# Patient Record
Sex: Male | Born: 1970 | Race: White | Hispanic: No | Marital: Single | Smoking: Former smoker
Health system: Southern US, Community
[De-identification: ages and names within clinical notes are randomized; demographics above are authoritative.]

## PROBLEM LIST (undated history)

## (undated) DIAGNOSIS — K219 Gastro-esophageal reflux disease without esophagitis: Secondary | ICD-10-CM

## (undated) DIAGNOSIS — K3532 Acute appendicitis with perforation and localized peritonitis, without abscess: Secondary | ICD-10-CM

## (undated) DIAGNOSIS — M1990 Unspecified osteoarthritis, unspecified site: Secondary | ICD-10-CM

## (undated) DIAGNOSIS — F419 Anxiety disorder, unspecified: Secondary | ICD-10-CM

## (undated) HISTORY — PX: VOCAL CORD INJECTION: SHX2663

## (undated) HISTORY — PX: CHOLECYSTECTOMY: SHX55

---

## 2005-08-15 ENCOUNTER — Ambulatory Visit (HOSPITAL_COMMUNITY): Admission: RE | Admit: 2005-08-15 | Discharge: 2005-08-15 | Payer: Self-pay | Admitting: Surgery

## 2006-09-16 ENCOUNTER — Ambulatory Visit: Payer: Self-pay | Admitting: Otolaryngology

## 2016-03-03 DIAGNOSIS — H8110 Benign paroxysmal vertigo, unspecified ear: Secondary | ICD-10-CM | POA: Diagnosis not present

## 2016-03-03 DIAGNOSIS — Z6839 Body mass index (BMI) 39.0-39.9, adult: Secondary | ICD-10-CM | POA: Diagnosis not present

## 2016-03-03 DIAGNOSIS — J019 Acute sinusitis, unspecified: Secondary | ICD-10-CM | POA: Diagnosis not present

## 2017-03-12 DIAGNOSIS — E7800 Pure hypercholesterolemia, unspecified: Secondary | ICD-10-CM | POA: Diagnosis not present

## 2017-03-12 DIAGNOSIS — E291 Testicular hypofunction: Secondary | ICD-10-CM | POA: Diagnosis not present

## 2017-03-12 DIAGNOSIS — F419 Anxiety disorder, unspecified: Secondary | ICD-10-CM | POA: Diagnosis not present

## 2017-03-12 DIAGNOSIS — G4700 Insomnia, unspecified: Secondary | ICD-10-CM | POA: Diagnosis not present

## 2017-03-12 DIAGNOSIS — F988 Other specified behavioral and emotional disorders with onset usually occurring in childhood and adolescence: Secondary | ICD-10-CM | POA: Diagnosis not present

## 2017-03-12 DIAGNOSIS — Z79899 Other long term (current) drug therapy: Secondary | ICD-10-CM | POA: Diagnosis not present

## 2017-04-14 ENCOUNTER — Inpatient Hospital Stay (HOSPITAL_COMMUNITY)
Admission: EM | Admit: 2017-04-14 | Discharge: 2017-04-17 | DRG: 372 | Disposition: A | Discharge status: Home / Self Care | Payer: BLUE CROSS/BLUE SHIELD | Attending: Emergency Medicine | Admitting: Emergency Medicine

## 2017-04-14 ENCOUNTER — Emergency Department (HOSPITAL_COMMUNITY): Payer: BLUE CROSS/BLUE SHIELD

## 2017-04-14 ENCOUNTER — Encounter (HOSPITAL_COMMUNITY): Payer: Self-pay | Admitting: Emergency Medicine

## 2017-04-14 DIAGNOSIS — K3532 Acute appendicitis with perforation and localized peritonitis, without abscess: Secondary | ICD-10-CM

## 2017-04-14 DIAGNOSIS — Z6834 Body mass index (BMI) 34.0-34.9, adult: Secondary | ICD-10-CM | POA: Diagnosis not present

## 2017-04-14 DIAGNOSIS — Z79899 Other long term (current) drug therapy: Secondary | ICD-10-CM | POA: Diagnosis not present

## 2017-04-14 DIAGNOSIS — Z87891 Personal history of nicotine dependence: Secondary | ICD-10-CM | POA: Diagnosis not present

## 2017-04-14 DIAGNOSIS — E871 Hypo-osmolality and hyponatremia: Secondary | ICD-10-CM | POA: Diagnosis not present

## 2017-04-14 DIAGNOSIS — K352 Acute appendicitis with generalized peritonitis: Secondary | ICD-10-CM | POA: Diagnosis not present

## 2017-04-14 DIAGNOSIS — Z9049 Acquired absence of other specified parts of digestive tract: Secondary | ICD-10-CM

## 2017-04-14 DIAGNOSIS — K353 Acute appendicitis with localized peritonitis: Secondary | ICD-10-CM | POA: Diagnosis not present

## 2017-04-14 DIAGNOSIS — K3580 Unspecified acute appendicitis: Secondary | ICD-10-CM | POA: Diagnosis not present

## 2017-04-14 DIAGNOSIS — K219 Gastro-esophageal reflux disease without esophagitis: Secondary | ICD-10-CM | POA: Diagnosis not present

## 2017-04-14 DIAGNOSIS — R1031 Right lower quadrant pain: Secondary | ICD-10-CM | POA: Diagnosis present

## 2017-04-14 HISTORY — DX: Acute appendicitis with perforation and localized peritonitis, without abscess: K35.32

## 2017-04-14 HISTORY — DX: Acute appendicitis with perforation, localized peritonitis, and gangrene, without abscess: K35.32

## 2017-04-14 HISTORY — DX: Anxiety disorder, unspecified: F41.9

## 2017-04-14 LAB — URINALYSIS, ROUTINE W REFLEX MICROSCOPIC
Bilirubin Urine: NEGATIVE
Glucose, UA: NEGATIVE mg/dL
KETONES UR: 20 mg/dL — AB
Leukocytes, UA: NEGATIVE
NITRITE: NEGATIVE
PROTEIN: NEGATIVE mg/dL
RBC / HPF: NONE SEEN RBC/hpf (ref 0–5)
Specific Gravity, Urine: 1.017 (ref 1.005–1.030)
Squamous Epithelial / LPF: NONE SEEN
pH: 5 (ref 5.0–8.0)

## 2017-04-14 LAB — CBC
HEMATOCRIT: 42.8 % (ref 39.0–52.0)
HEMOGLOBIN: 14.8 g/dL (ref 13.0–17.0)
MCH: 30.1 pg (ref 26.0–34.0)
MCHC: 34.6 g/dL (ref 30.0–36.0)
MCV: 87.2 fL (ref 78.0–100.0)
Platelets: 249 10*3/uL (ref 150–400)
RBC: 4.91 MIL/uL (ref 4.22–5.81)
RDW: 12.9 % (ref 11.5–15.5)
WBC: 14 10*3/uL — ABNORMAL HIGH (ref 4.0–10.5)

## 2017-04-14 LAB — COMPREHENSIVE METABOLIC PANEL
ALBUMIN: 3.8 g/dL (ref 3.5–5.0)
ALT: 58 U/L (ref 17–63)
ANION GAP: 9 (ref 5–15)
AST: 37 U/L (ref 15–41)
Alkaline Phosphatase: 94 U/L (ref 38–126)
BUN: 11 mg/dL (ref 6–20)
CHLORIDE: 95 mmol/L — AB (ref 101–111)
CO2: 27 mmol/L (ref 22–32)
Calcium: 9.1 mg/dL (ref 8.9–10.3)
Creatinine, Ser: 1.05 mg/dL (ref 0.61–1.24)
GFR calc Af Amer: >60 mL/min (ref >60)
GFR calc non Af Amer: >60 mL/min (ref >60)
GLUCOSE: 114 mg/dL — AB (ref 65–99)
POTASSIUM: 4.2 mmol/L (ref 3.5–5.1)
SODIUM: 131 mmol/L — AB (ref 135–145)
Total Bilirubin: 1.1 mg/dL (ref 0.3–1.2)
Total Protein: 7.3 g/dL (ref 6.5–8.1)

## 2017-04-14 LAB — LIPASE, BLOOD: Lipase: 24 U/L (ref 11–51)

## 2017-04-14 LAB — I-STAT CG4 LACTIC ACID, ED: LACTIC ACID, VENOUS: 1.28 mmol/L (ref 0.5–1.9)

## 2017-04-14 IMAGING — CT CT ABD-PELV W/ CM
2 of 5 series · 16 of 46 positions shown, 18 images · IV contrast (iopamidol)
Comparison: None.

CLINICAL DATA: 45 y/o M; 2 days of right lower quadrant abdominal
pain.

EXAM:
CT ABDOMEN AND PELVIS WITH CONTRAST
TECHNIQUE: Multidetector CT imaging of the abdomen and pelvis was performed
using the standard protocol following bolus administration of
intravenous contrast.
CONTRAST:  100mL [VZ] IOPAMIDOL ([VZ]) INJECTION 61%

[Series 3: abdroutine 5.0 i31s 1 · axial · 0.82mm/px · z∈[-547,-67]mm · 13 of 109 slices shown, 15 images]
[im 7/109  soft-tissue]
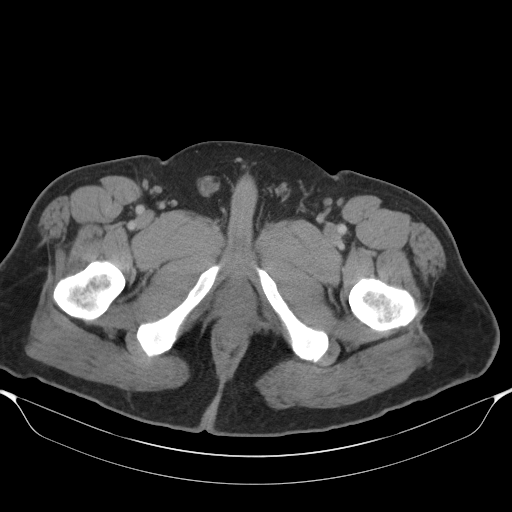
[im 7/109  bone]
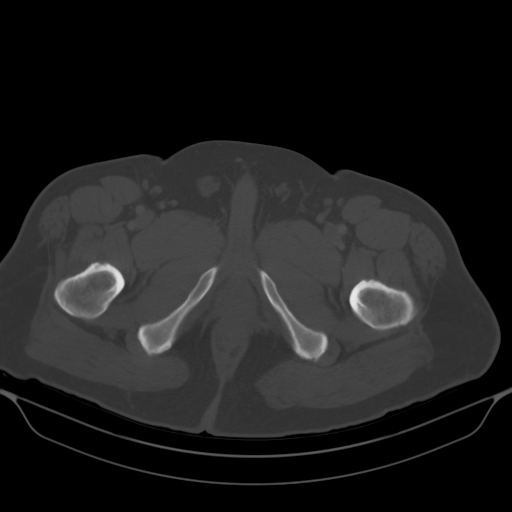
[im 13/109  soft-tissue]
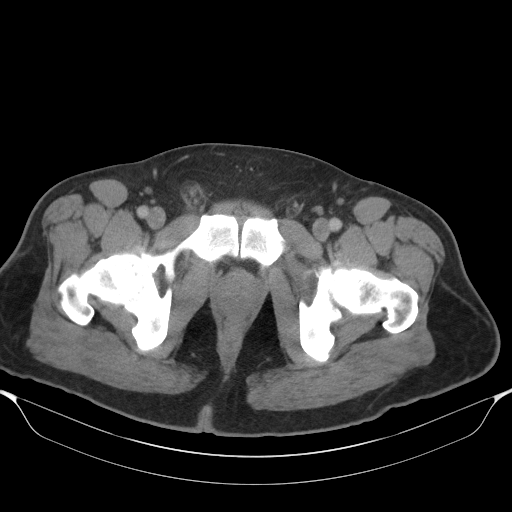
[im 25/109  soft-tissue]
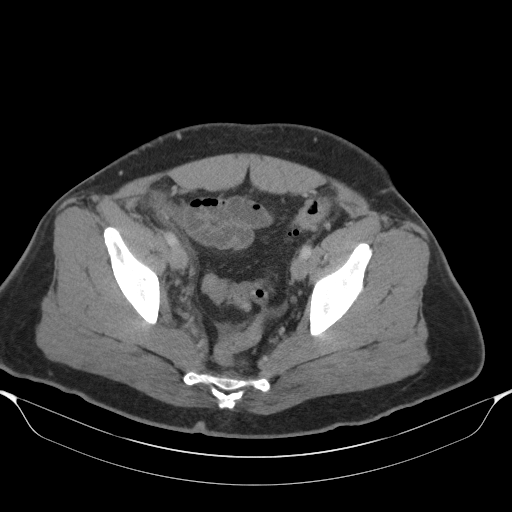
[im 31/109  soft-tissue]
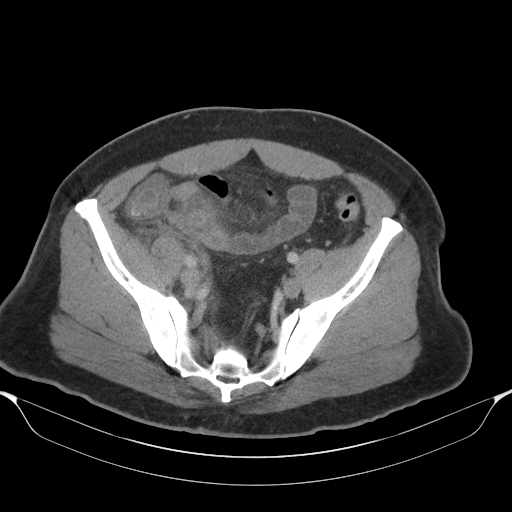
[im 37/109  soft-tissue]
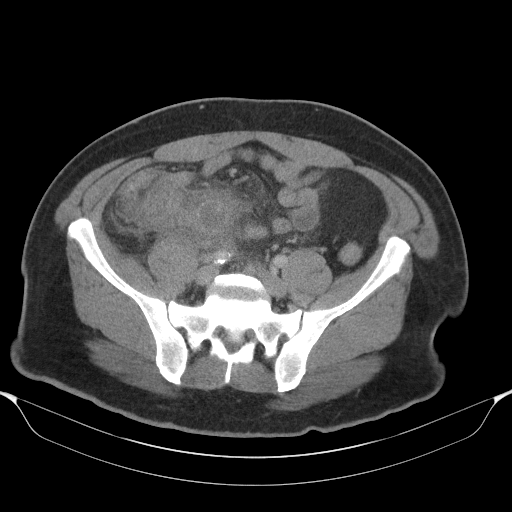
[im 49/109  soft-tissue]
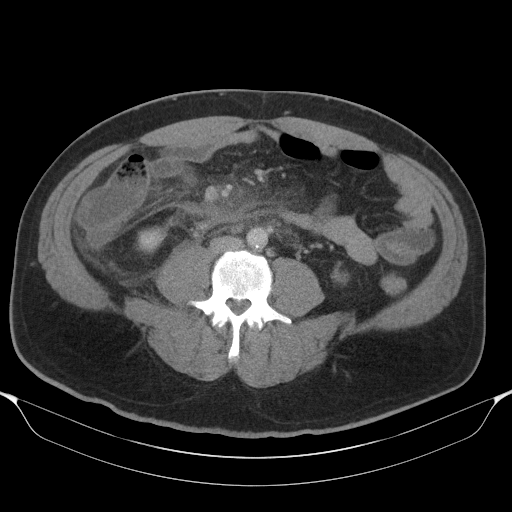
[im 55/109  soft-tissue]
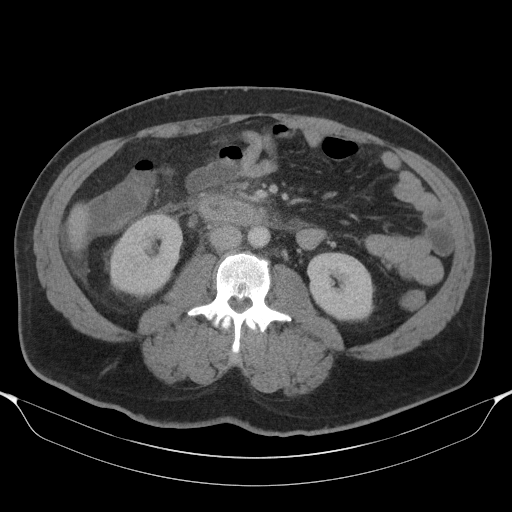
[im 61/109  soft-tissue]
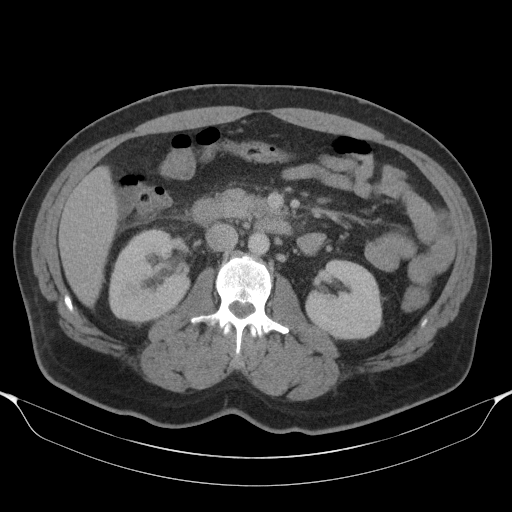
[im 73/109  soft-tissue]
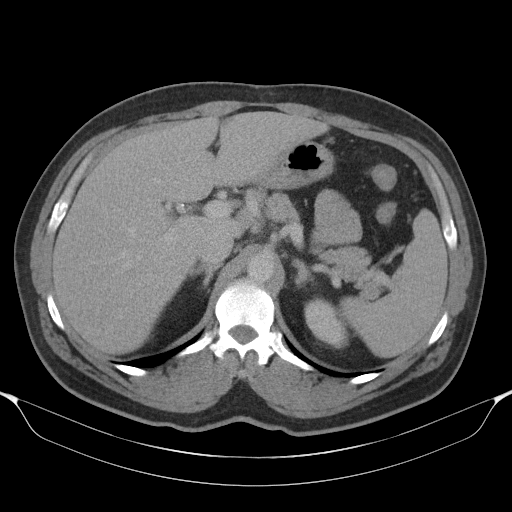
[im 73/109  bone]
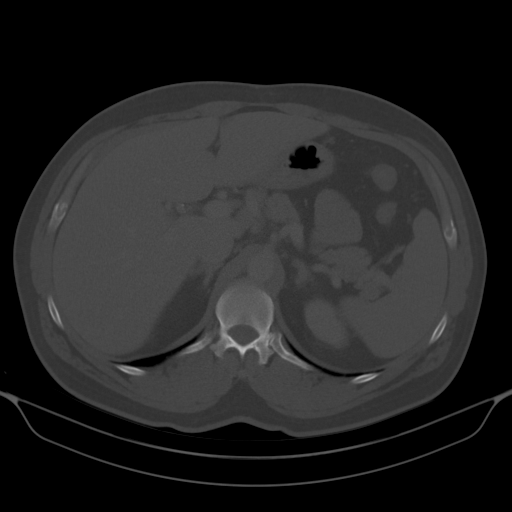
[im 79/109  soft-tissue]
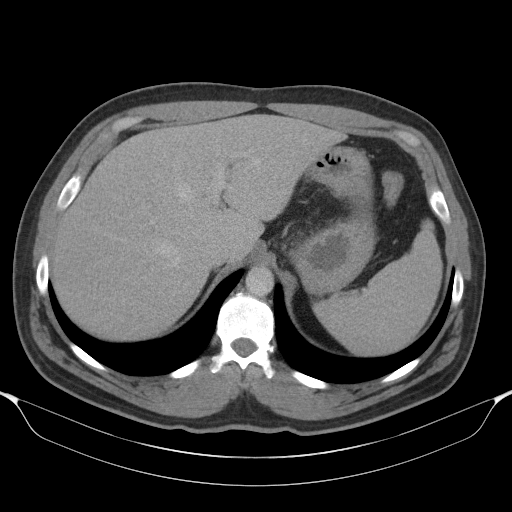
[im 85/109  soft-tissue]
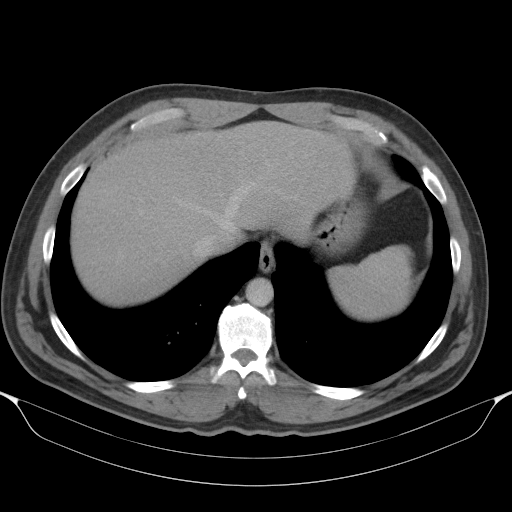
[im 97/109  soft-tissue]
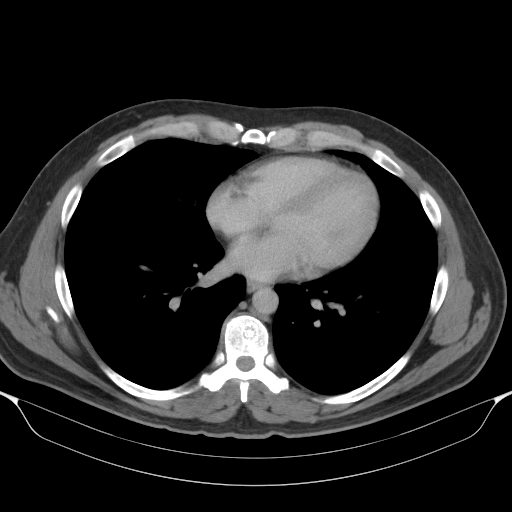
[im 103/109  soft-tissue]
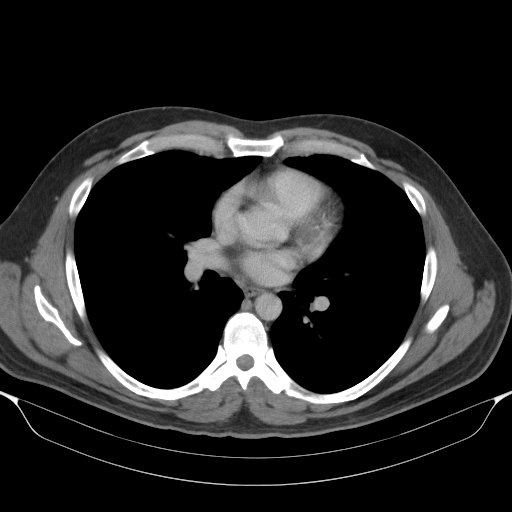

[Series 6: abdroutine 2.0 mpr cor · coronal · 0.89mm/px · 3 of 134 slices shown]
[im 45/134  soft-tissue]
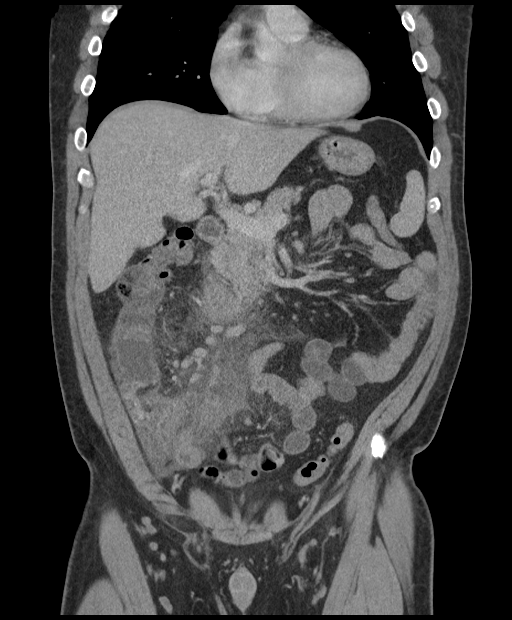
[im 60/134  soft-tissue]
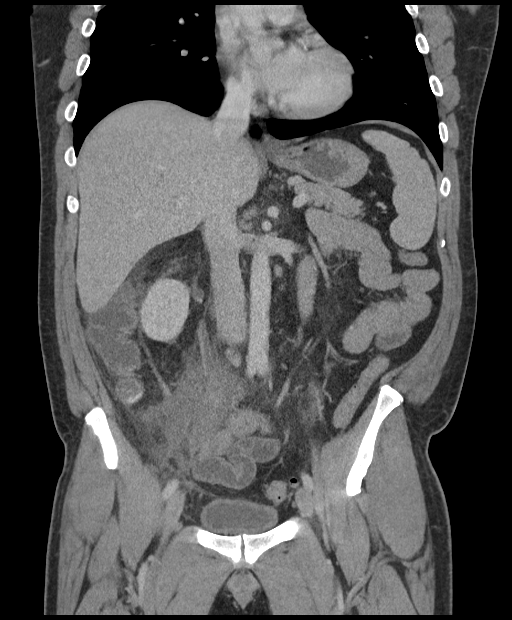
[im 74/134  soft-tissue]
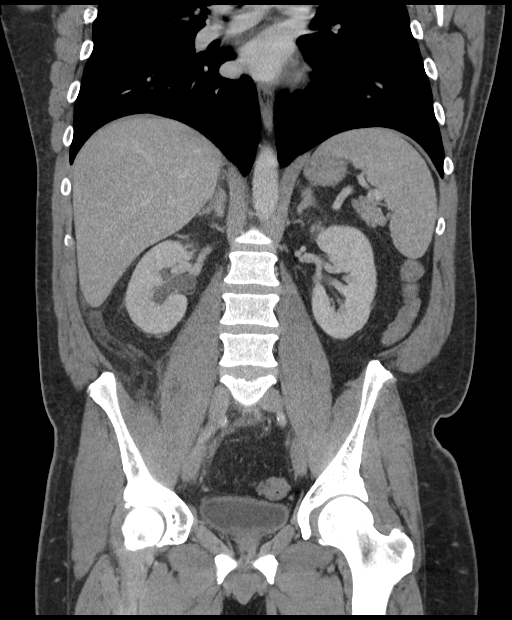

[16 of 46 positions shown; findings below may reference images not displayed]

FINDINGS: Lower chest: No acute abnormality.

Hepatobiliary: No focal liver abnormality is seen. Status post
cholecystectomy. No biliary dilatation.

Pancreas: Unremarkable. No pancreatic ductal dilatation or
surrounding inflammatory changes.

Spleen: Normal in size without focal abnormality.

Adrenals/Urinary Tract: Normal adrenal glands. No focal kidney
abnormality. No left hydronephrosis. Mild right hydronephrosis
without obstructing stone or lesion is likely due to reactive
inflammatory changes. Normal bladder.

Stomach/Bowel: Enlarged appendix measuring at least 20 mm with
enhancing thickened wall, severe extensive inflammatory changes in
the mid abdomen and right lower quadrant, and a large appendix wall
defect indicating perforation (Series 6, image 54). Fluid
surrounding the inflamed appendix likely represents phlegmon. No
discrete organized periappendiceal abscess identified at this time.
No pneumoperitoneum.

Reactive inflammatory changes of surrounding loops of small bowel
and the cecum.

Vascular/Lymphatic: Mild calcific atherosclerosis of the infrarenal
abdominal aorta and bilateral common iliac artery's. Mesenteric and
right lower quadrant lymphadenopathy is likely reactive.

Reproductive: Prostate is unremarkable.

Other: No abdominal wall hernia or abnormality. No abdominopelvic
ascites.

Musculoskeletal: No acute or significant osseous findings.
IMPRESSION: Perforated acute appendicitis with severe extensive inflammatory
changes in the mid abdomen and right lower quadrant. Fluid
surrounding the appendix is likely phlegmon. No discrete rim
enhancing periappendiceal abscess identified at this time.

These results were called by telephone at the time of interpretation
on [DATE] at [DATE] to Dr. LANDERO , who verbally
acknowledged these results.

By: LANDERO M.D.

## 2017-04-14 MED ORDER — PANTOPRAZOLE SODIUM 40 MG IV SOLR
40.0000 mg | Freq: Every day | INTRAVENOUS | Status: DC
  Administered 2017-04-14 – 2017-04-16 (×3): 40 mg via INTRAVENOUS
  Filled 2017-04-14 (×3): qty 40

## 2017-04-14 MED ORDER — ONDANSETRON HCL 4 MG/2ML IJ SOLN
4.0000 mg | Freq: Once | INTRAMUSCULAR | Status: AC
  Administered 2017-04-14: 4 mg via INTRAVENOUS
  Filled 2017-04-14: qty 2

## 2017-04-14 MED ORDER — HYDRALAZINE HCL 20 MG/ML IJ SOLN: 10.0000 mg | INTRAMUSCULAR | Status: DC | PRN

## 2017-04-14 MED ORDER — OXYCODONE HCL 5 MG PO TABS
5.0000 mg | ORAL_TABLET | ORAL | Status: DC | PRN
  Administered 2017-04-14 – 2017-04-16 (×7): 10 mg via ORAL
  Filled 2017-04-14 (×9): qty 2

## 2017-04-14 MED ORDER — DIPHENHYDRAMINE HCL 25 MG PO CAPS: 25.0000 mg | ORAL_CAPSULE | Freq: Four times a day (QID) | ORAL | Status: DC | PRN

## 2017-04-14 MED ORDER — ONDANSETRON HCL 4 MG/2ML IJ SOLN: 4.0000 mg | Freq: Four times a day (QID) | INTRAMUSCULAR | Status: DC | PRN

## 2017-04-14 MED ORDER — ONDANSETRON 4 MG PO TBDP: 4.0000 mg | ORAL_TABLET | Freq: Four times a day (QID) | ORAL | Status: DC | PRN

## 2017-04-14 MED ORDER — MORPHINE SULFATE (PF) 4 MG/ML IV SOLN
4.0000 mg | Freq: Once | INTRAVENOUS | Status: AC
  Administered 2017-04-14: 4 mg via INTRAVENOUS
  Filled 2017-04-14: qty 1

## 2017-04-14 MED ORDER — SODIUM CHLORIDE 0.9 % IV BOLUS (SEPSIS)
1000.0000 mL | Freq: Once | INTRAVENOUS | Status: AC
  Administered 2017-04-14: 1000 mL via INTRAVENOUS

## 2017-04-14 MED ORDER — IOPAMIDOL (ISOVUE-300) INJECTION 61%
INTRAVENOUS | Status: AC
  Administered 2017-04-14: 100 mL
  Filled 2017-04-14: qty 100

## 2017-04-14 MED ORDER — PIPERACILLIN-TAZOBACTAM 3.375 G IVPB 30 MIN
3.3750 g | Freq: Once | INTRAVENOUS | Status: DC
  Administered 2017-04-14: 3.375 g via INTRAVENOUS
  Filled 2017-04-14: qty 50

## 2017-04-14 MED ORDER — SODIUM CHLORIDE 0.9 % IV SOLN
Freq: Once | INTRAVENOUS | Status: AC
  Administered 2017-04-14: 125 mL/h via INTRAVENOUS

## 2017-04-14 MED ORDER — POLYETHYLENE GLYCOL 3350 17 G PO PACK: 17.0000 g | PACK | Freq: Every day | ORAL | Status: DC | PRN

## 2017-04-14 MED ORDER — ZOLPIDEM TARTRATE 5 MG PO TABS
10.0000 mg | ORAL_TABLET | Freq: Every evening | ORAL | Status: DC | PRN
Start: 2017-04-14 — End: 2017-04-17
  Administered 2017-04-14 – 2017-04-16 (×3): 10 mg via ORAL
  Filled 2017-04-14 (×3): qty 2

## 2017-04-14 MED ORDER — KCL IN DEXTROSE-NACL 20-5-0.45 MEQ/L-%-% IV SOLN
INTRAVENOUS | Status: DC
  Administered 2017-04-14 – 2017-04-17 (×5): via INTRAVENOUS
  Filled 2017-04-14 (×5): qty 1000

## 2017-04-14 MED ORDER — PIPERACILLIN-TAZOBACTAM 3.375 G IVPB
3.3750 g | Freq: Three times a day (TID) | INTRAVENOUS | Status: DC
  Administered 2017-04-14 – 2017-04-17 (×8): 3.375 g via INTRAVENOUS
  Filled 2017-04-14 (×10): qty 50

## 2017-04-14 MED ORDER — DIPHENHYDRAMINE HCL 50 MG/ML IJ SOLN: 25.0000 mg | Freq: Four times a day (QID) | INTRAMUSCULAR | Status: DC | PRN

## 2017-04-14 MED ORDER — ACETAMINOPHEN 325 MG PO TABS
650.0000 mg | ORAL_TABLET | Freq: Four times a day (QID) | ORAL | Status: DC | PRN
  Administered 2017-04-16 – 2017-04-17 (×4): 650 mg via ORAL
  Filled 2017-04-14 (×4): qty 2

## 2017-04-14 MED ORDER — ENOXAPARIN SODIUM 40 MG/0.4ML ~~LOC~~ SOLN
40.0000 mg | SUBCUTANEOUS | Status: DC
  Administered 2017-04-14 – 2017-04-16 (×3): 40 mg via SUBCUTANEOUS
  Filled 2017-04-14 (×3): qty 0.4

## 2017-04-14 NOTE — ED Notes (Signed)
On way to CT 

## 2017-04-14 NOTE — ED Triage Notes (Signed)
Pt sts RLQ pain x 1 week with fever; pt sent for eval for appy

## 2017-04-14 NOTE — ED Notes (Signed)
Unsuccessful attempt at giving report to 6N. 

## 2017-04-14 NOTE — ED Notes (Signed)
Pt given ginger ale to drink. 

## 2017-04-14 NOTE — ED Provider Notes (Signed)
[facility]-EMERGENCY DEPT Provider Note   CSN: [id number] Arrival date & time: 04/14/17  1153  By signing my name below, I, Marnette Burgessyan Andrew Long, attest that this documentation has been prepared under the direction and in the presence of Jacalyn LefevreHaviland, Maikel Neisler, MD. Electronically Signed: Marnette Burgessyan Andrew Long, Scribe. 04/14/2017. 12:25 PM.  History   Chief Complaint Chief Complaint  Patient presents with  . Abdominal Pain   The history is provided by the patient and medical records. No language interpreter was used.   HPI Comments:  Chase Frazier is a 46 y.o. male with no pertinent PMHx, who presents to the Emergency Department complaining of constant, gradually worsening, 8/10, RLQ pain onset one week ago, worsening four days PTA. Pt reports this pain arose one week ago s/p finishing his course of Abx for a sinusitis infection. They tried probiotics at home without relief followed by a fever and increasing pain spiking four days ago. He was seen at his PCP's office in [place], [place], Lonie Peak(Nathan Conroy PA-C), this morning for this issue who referred him here for further evaluation. Pt has associated symptoms of constipation and generalized weakness. He denies diarrhea, vomiting, and any other complaints at this time.  History reviewed. No pertinent past medical history.  There are no active problems to display for this patient.  History reviewed. No pertinent surgical history.  Home Medications    Prior to Admission medications   Not on File   Family History History reviewed. No pertinent family history.  Social History Social History  Substance Use Topics  . Smoking status: Former Games developermoker  . Smokeless tobacco: Never Used  . Alcohol use No   Allergies   Patient has no known allergies.   Review of Systems Review of Systems All systems reviewed and are negative for acute change except as noted in the HPI.    Physical Exam Updated Vital Signs BP (!) 139/58 (BP Location: Right Arm)   Pulse 94    Temp 99.8 F (37.7 C) (Oral)   Resp 20   SpO2 98%   Physical Exam  Constitutional: He is oriented to person, place, and time. He appears well-developed and well-nourished.  HENT:  Head: Normocephalic.  Eyes: Conjunctivae are normal.  Cardiovascular: Normal rate.   Pulmonary/Chest: Effort normal.  Abdominal: He exhibits no distension. There is tenderness in the right lower quadrant.  Musculoskeletal: Normal range of motion.  Neurological: He is alert and oriented to person, place, and time.  Skin: Skin is warm and dry.  Psychiatric: He has a normal mood and affect.  Nursing note and vitals reviewed.    ED Treatments / Results  DIAGNOSTIC STUDIES:  Oxygen Saturation is 98% on RA, normal by my interpretation.    COORDINATION OF CARE:  12:21 PM Discussed treatment plan with pt at bedside including blood work, UA, CT A/P, IV Fluids, and pain medication and pt agreed to plan. Pt is not driving himself home from the ED.   1:26 PM Pt re-evaluated initial time.  Labs (all labs ordered are listed, but only abnormal results are displayed) Labs Reviewed  COMPREHENSIVE METABOLIC PANEL - Abnormal; Notable for the following:       Result Value   Sodium 131 (*)    Chloride 95 (*)    Glucose, Bld 114 (*)    All other components within normal limits  CBC - Abnormal; Notable for the following:    WBC 14.0 (*)    All other components within normal limits  URINALYSIS, ROUTINE W  REFLEX MICROSCOPIC - Abnormal; Notable for the following:    Hgb urine dipstick MODERATE (*)    Ketones, ur 20 (*)    Bacteria, UA RARE (*)    All other components within normal limits  LIPASE, BLOOD  I-STAT CG4 LACTIC ACID, ED  I-STAT CG4 LACTIC ACID, ED    EKG  EKG Interpretation None       Radiology Ct Abdomen Pelvis W Contrast  Result Date: 04/14/2017 CLINICAL DATA:  46 y/o M; 2 days of right lower quadrant abdominal pain. EXAM: CT ABDOMEN AND PELVIS WITH CONTRAST TECHNIQUE: Multidetector CT  imaging of the abdomen and pelvis was performed using the standard protocol following bolus administration of intravenous contrast. CONTRAST:  ISOVUE-300 IOPAMIDOL (ISOVUE-300) INJECTION 61% COMPARISON:  None. FINDINGS: Lower chest: No acute abnormality. Hepatobiliary: No focal liver abnormality is seen. Status post cholecystectomy. No biliary dilatation. Pancreas: Unremarkable. No pancreatic ductal dilatation or surrounding inflammatory changes. Spleen: Normal in size without focal abnormality. Adrenals/Urinary Tract: Normal adrenal glands. No focal kidney abnormality. No left hydronephrosis. Mild right hydronephrosis without obstructing stone or lesion is likely due to reactive inflammatory changes. Normal bladder. Stomach/Bowel: Enlarged appendix measuring at least 20 mm with enhancing thickened wall, severe extensive inflammatory changes in the mid abdomen and right lower quadrant, and a large appendix wall defect indicating perforation (Series 6, image 54). Fluid surrounding the inflamed appendix likely represents phlegmon. No discrete organized periappendiceal abscess identified at this time. No pneumoperitoneum. Reactive inflammatory changes of surrounding loops of small bowel and the cecum. Vascular/Lymphatic: Mild calcific atherosclerosis of the infrarenal abdominal aorta and bilateral common iliac artery's. Mesenteric and right lower quadrant lymphadenopathy is likely reactive. Reproductive: Prostate is unremarkable. Other: No abdominal wall hernia or abnormality. No abdominopelvic ascites. Musculoskeletal: No acute or significant osseous findings. IMPRESSION: Perforated acute appendicitis with severe extensive inflammatory changes in the mid abdomen and right lower quadrant. Fluid surrounding the appendix is likely phlegmon. No discrete rim enhancing periappendiceal abscess identified at this time. These results were called by telephone at the time of interpretation on 04/14/2017 at 2:42 pm to Dr.  Jacalyn Lefevre , who verbally acknowledged these results. Electronically Signed   By: Mitzi Hansen M.D.   On: 04/14/2017 14:48    Procedures Procedures (including critical care time)  Medications Ordered in ED Medications  piperacillin-tazobactam (ZOSYN) IVPB 3.375 g (not administered)  sodium chloride 0.9 % bolus 1,000 mL (0 mLs Intravenous Stopped 04/14/17 1326)  morphine 4 MG/ML injection 4 mg (4 mg Intravenous Given 04/14/17 1251)  ondansetron (ZOFRAN) injection 4 mg (4 mg Intravenous Given 04/14/17 1252)  iopamidol (ISOVUE-300) 61 % injection (100 mLs  Contrast Given 04/14/17 1419)  0.9 %  sodium chloride infusion (125 mL/hr Intravenous New Bag/Given 04/14/17 1338)     Initial Impression / Assessment and Plan / ED Course  I have reviewed the triage vital signs and the nursing notes.  Pertinent labs & imaging results that were available during my care of the patient were reviewed by me and considered in my medical decision making (see chart for details).   Pt feeling better after IV fluids, pain meds.  He looks much more comfortable.  CT abd/pelvis results reviewed and d/w patient.  Pt d/w Dr. Derrell Lolling (surgery) who will see pt and admit.  Final Clinical Impressions(s) / ED Diagnoses   Final diagnoses:  Acute perforated appendicitis    New Prescriptions New Prescriptions   No medications on file    I personally performed the services described  in this documentation, which was scribed in my presence. The recorded information has been reviewed and is accurate.     Jacalyn Lefevre, MD 04/14/17 [phone]

## 2017-04-14 NOTE — Consult Note (Signed)
[facility] Admission Note  Chase Frazier 29-Apr-1971  [id number].    Requesting MD: Gilford Raid, MD Chief Complaint/Reason for Consult: acute appendicitis with perforation  HPI:  46 y.o. Male who presented to [facility] with 7-8 days of RLQ pain. Pain described as constant, sharp, with some radiation to upper abdomen. Associated symptoms include fever, generalized weakness, and constipation. Tried probiotics without relief. Denies aggravating factors. Patient is a former smoker who quit over 10 years ago. Denies alcohol or illicit drug use. Denies use of blood thinning medications. Has a surgical history of laparoscopic cholecystectomy.   ED workup: WBC 14.0, lactic acid WNL,  CT significant for acute perforated appendicitis with phlegmon formation.   ROS: Review of Systems  Constitutional: Positive for fever.  Gastrointestinal: Positive for abdominal pain, constipation and nausea.  Neurological: Positive for weakness.  All other systems reviewed and are negative.   History reviewed. No pertinent family history.  History reviewed. No pertinent past medical history.  History reviewed. No pertinent surgical history.  Social History:  reports that he has quit smoking. He has never used smokeless tobacco. He reports that he does not drink alcohol or use drugs.  Allergies: No Known Allergies   (Not in a hospital admission)  Blood pressure (!) 139/58, pulse 94, temperature 99.8 F (37.7 C), temperature source Oral, resp. rate 20, SpO2 98 %. Physical Exam: Physical Exam  Constitutional: He is oriented to person, place, and time. He appears well-developed and well-nourished. No distress.  HENT:  Head: Normocephalic and atraumatic.  Right Ear: External ear normal.  Left Ear: External ear normal.  Mouth/Throat: Oropharynx is clear and moist.  Eyes: EOM are normal. Pupils are equal, round, and reactive to light. No scleral icterus.  Neck: Normal range of motion. Neck supple.  No tracheal deviation present.  Cardiovascular: Normal rate, regular rhythm, normal heart sounds and intact distal pulses.  Exam reveals no friction rub.   No murmur heard. Pulmonary/Chest: Effort normal and breath sounds normal. No stridor. No respiratory distress. He has no wheezes. He has no rales.  Abdominal: Soft. Bowel sounds are normal. He exhibits no distension and no mass. There is tenderness. There is guarding. No hernia.  RLQ tenderness with guarding  Musculoskeletal: Normal range of motion. He exhibits no edema or deformity.  Neurological: He is alert and oriented to person, place, and time. No sensory deficit.  Skin: Skin is warm and dry. No rash noted. He is not diaphoretic. No erythema.  Psychiatric: He has a normal mood and affect. His behavior is normal.    Results for orders placed or performed during the hospital encounter of 04/14/17 (from the past 48 hour(s))  Lipase, blood     Status: None   Collection Time: 04/14/17 12:33 PM  Result Value Ref Range   Lipase 24 11 - 51 U/L  Comprehensive metabolic panel     Status: Abnormal   Collection Time: 04/14/17 12:33 PM  Result Value Ref Range   Sodium 131 (L) 135 - 145 mmol/L   Potassium 4.2 3.5 - 5.1 mmol/L   Chloride 95 (L) 101 - 111 mmol/L   CO2 27 22 - 32 mmol/L   Glucose, Bld 114 (H) 65 - 99 mg/dL   BUN 11 6 - 20 mg/dL   Creatinine, Ser 1.05 0.61 - 1.24 mg/dL   Calcium 9.1 8.9 - 10.3 mg/dL   Total Protein 7.3 6.5 - 8.1 g/dL   Albumin 3.8 3.5 - 5.0 g/dL   AST 37 15 -  41 U/L   ALT 58 17 - 63 U/L   Alkaline Phosphatase 94 38 - 126 U/L   Total Bilirubin 1.1 0.3 - 1.2 mg/dL   GFR calc non Af Amer >60 >60 mL/min   GFR calc Af Amer >60 >60 mL/min    Comment: (NOTE) The eGFR has been calculated using the CKD EPI equation. This calculation has not been validated in all clinical situations. eGFR's persistently <60 mL/min signify possible Chronic Kidney Disease.    Anion gap 9 5 - 15  CBC     Status: Abnormal    Collection Time: 04/14/17 12:33 PM  Result Value Ref Range   WBC 14.0 (H) 4.0 - 10.5 K/uL   RBC 4.91 4.22 - 5.81 MIL/uL   Hemoglobin 14.8 13.0 - 17.0 g/dL   HCT 42.8 39.0 - 52.0 %   MCV 87.2 78.0 - 100.0 fL   MCH 30.1 26.0 - 34.0 pg   MCHC 34.6 30.0 - 36.0 g/dL   RDW 12.9 11.5 - 15.5 %   Platelets 249 150 - 400 K/uL  I-Stat CG4 Lactic Acid, ED     Status: None   Collection Time: 04/14/17 12:34 PM  Result Value Ref Range   Lactic Acid, Venous 1.28 0.5 - 1.9 mmol/L  Urinalysis, Routine w reflex microscopic     Status: Abnormal   Collection Time: 04/14/17 12:36 PM  Result Value Ref Range   Color, Urine YELLOW YELLOW   APPearance CLEAR CLEAR   Specific Gravity, Urine 1.017 1.005 - 1.030   pH 5.0 5.0 - 8.0   Glucose, UA NEGATIVE NEGATIVE mg/dL   Hgb urine dipstick MODERATE (A) NEGATIVE   Bilirubin Urine NEGATIVE NEGATIVE   Ketones, ur 20 (A) NEGATIVE mg/dL   Protein, ur NEGATIVE NEGATIVE mg/dL   Nitrite NEGATIVE NEGATIVE   Leukocytes, UA NEGATIVE NEGATIVE   RBC / HPF NONE SEEN 0 - 5 RBC/hpf   WBC, UA 0-5 0 - 5 WBC/hpf   Bacteria, UA RARE (A) NONE SEEN   Squamous Epithelial / LPF NONE SEEN NONE SEEN   Mucous PRESENT    Hyaline Casts, UA PRESENT    Ct Abdomen Pelvis W Contrast  Result Date: 04/14/2017 CLINICAL DATA:  46 y/o M; 2 days of right lower quadrant abdominal pain. EXAM: CT ABDOMEN AND PELVIS WITH CONTRAST TECHNIQUE: Multidetector CT imaging of the abdomen and pelvis was performed using the standard protocol following bolus administration of intravenous contrast. CONTRAST:  188m ISOVUE-300 IOPAMIDOL (ISOVUE-300) INJECTION 61% COMPARISON:  None. FINDINGS: Lower chest: No acute abnormality. Hepatobiliary: No focal liver abnormality is seen. Status post cholecystectomy. No biliary dilatation. Pancreas: Unremarkable. No pancreatic ductal dilatation or surrounding inflammatory changes. Spleen: Normal in size without focal abnormality. Adrenals/Urinary Tract: Normal adrenal glands.  No focal kidney abnormality. No left hydronephrosis. Mild right hydronephrosis without obstructing stone or lesion is likely due to reactive inflammatory changes. Normal bladder. Stomach/Bowel: Enlarged appendix measuring at least 20 mm with enhancing thickened wall, severe extensive inflammatory changes in the mid abdomen and right lower quadrant, and a large appendix wall defect indicating perforation (Series 6, image 54). Fluid surrounding the inflamed appendix likely represents phlegmon. No discrete organized periappendiceal abscess identified at this time. No pneumoperitoneum. Reactive inflammatory changes of surrounding loops of small bowel and the cecum. Vascular/Lymphatic: Mild calcific atherosclerosis of the infrarenal abdominal aorta and bilateral common iliac artery's. Mesenteric and right lower quadrant lymphadenopathy is likely reactive. Reproductive: Prostate is unremarkable. Other: No abdominal wall hernia or abnormality. No abdominopelvic ascites.  Musculoskeletal: No acute or significant osseous findings. IMPRESSION: Perforated acute appendicitis with severe extensive inflammatory changes in the mid abdomen and right lower quadrant. Fluid surrounding the appendix is likely phlegmon. No discrete rim enhancing periappendiceal abscess identified at this time. These results were called by telephone at the time of interpretation on 04/14/2017 at 2:42 pm to Dr. Isla Pence , who verbally acknowledged these results. Electronically Signed   By: Kristine Garbe M.D.   On: 04/14/2017 14:48      Assessment/Plan Perforated appendicitis - IV Zosyn, CBC in AM - IR consult for RLQ perc drain - Dr. Earleen Newport does not feel there is any abscess amenable to drainage at this time. Recommend IV abx and observation.  - clear liquid diet - pain control/IS - mobilize as tolerated   GERD- protonix FEN - clear liquids, IVF, BMET in AM ID - Zosyn 5/22 >> VTE - Lovenox, SCD's   Jill Alexanders,  [facility] Surgery 04/14/2017, 3:11 PM Pager: [phone] Consults: [phone] Mon-Fri 7:00 am-4:30 pm Sat-Sun 7:00 am-11:30 am

## 2017-04-15 ENCOUNTER — Encounter (HOSPITAL_COMMUNITY): Payer: Self-pay | Admitting: General Practice

## 2017-04-15 LAB — BASIC METABOLIC PANEL
ANION GAP: 7 (ref 5–15)
BUN: 10 mg/dL (ref 6–20)
CO2: 26 mmol/L (ref 22–32)
Calcium: 8.2 mg/dL — ABNORMAL LOW (ref 8.9–10.3)
Chloride: 97 mmol/L — ABNORMAL LOW (ref 101–111)
Creatinine, Ser: 1.18 mg/dL (ref 0.61–1.24)
GFR calc non Af Amer: >60 mL/min (ref >60)
Glucose, Bld: 112 mg/dL — ABNORMAL HIGH (ref 65–99)
Potassium: 4.7 mmol/L (ref 3.5–5.1)
SODIUM: 130 mmol/L — AB (ref 135–145)

## 2017-04-15 LAB — CBC
HEMATOCRIT: 41.5 % (ref 39.0–52.0)
HEMOGLOBIN: 13.6 g/dL (ref 13.0–17.0)
MCH: 28.9 pg (ref 26.0–34.0)
MCHC: 32.8 g/dL (ref 30.0–36.0)
MCV: 88.3 fL (ref 78.0–100.0)
Platelets: 253 10*3/uL (ref 150–400)
RBC: 4.7 MIL/uL (ref 4.22–5.81)
RDW: 13.1 % (ref 11.5–15.5)
WBC: 13.6 10*3/uL — AB (ref 4.0–10.5)

## 2017-04-15 LAB — HIV ANTIBODY (ROUTINE TESTING W REFLEX): HIV Screen 4th Generation wRfx: NONREACTIVE

## 2017-04-15 NOTE — Progress Notes (Signed)
[facility] Progress Note     Subjective: CC: abdominal pain Abdominal pain minimally improved compared to yesterday - worse with any movement. Endorses chills last night. Denies fever, nausea, or vomiting. Tolerating clears without exacerbation of pain. Urinating without hesitancy. Denies BM.   Objective: Vital signs in last 24 hours: Temp:  [98.9 F (37.2 C)-99.8 F (37.7 C)] 99.6 F (37.6 C) (05/23 0618) Pulse Rate:  [80-111] 95 (05/23 0618) Resp:  [17-20] 17 (05/23 0618) BP: (110-146)/(50-82) 129/72 (05/23 0618) SpO2:  [95 %-99 %] 95 % (05/23 0618) Last BM Date: 04/14/17  Intake/Output from previous day: 05/22 0701 - 05/23 0700 In: 2293.3 [P.O.:360; I.V.:833.3; IV Piggyback:1100] Out: -  Intake/Output this shift: No intake/output data recorded.  PE: Gen:  Alert, NAD, pleasant and cooperative Card:  Regular rate and rhythm, pedal pulses 2+ BL Pulm:  Normal effort, clear to auscultation bilaterally Abd: Soft, TTP over RLQ with mild peritoneal signs and guarding, non-distended, +BS Skin: warm and dry, no rashes  Psych: A&Ox3   Lab Results:   Recent Labs  04/14/17 1233 04/15/17 0516  WBC 14.0* 13.6*  HGB 14.8 13.6  HCT 42.8 41.5  PLT 249 253   BMET  Recent Labs  04/14/17 1233 04/15/17 0516  NA 131* 130*  K 4.2 4.7  CL 95* 97*  CO2 27 26  GLUCOSE 114* 112*  BUN 11 10  CREATININE 1.05 1.18  CALCIUM 9.1 8.2*   PT/INR No results for input(s): LABPROT, INR in the last 72 hours. CMP     Component Value Date/Time   NA 130 (L) 04/15/2017 0516   K 4.7 04/15/2017 0516   CL 97 (L) 04/15/2017 0516   CO2 26 04/15/2017 0516   GLUCOSE 112 (H) 04/15/2017 0516   BUN 10 04/15/2017 0516   CREATININE 1.18 04/15/2017 0516   CALCIUM 8.2 (L) 04/15/2017 0516   PROT 7.3 04/14/2017 1233   ALBUMIN 3.8 04/14/2017 1233   AST 37 04/14/2017 1233   ALT 58 04/14/2017 1233   ALKPHOS 94 04/14/2017 1233   BILITOT 1.1 04/14/2017 1233   GFRNONAA >60 04/15/2017  0516   GFRAA >60 04/15/2017 0516   Lipase     Component Value Date/Time   LIPASE 24 04/14/2017 1233       Studies/Results: Ct Abdomen Pelvis W Contrast  Result Date: 04/14/2017 CLINICAL DATA:  46 y/o M; 2 days of right lower quadrant abdominal pain. EXAM: CT ABDOMEN AND PELVIS WITH CONTRAST TECHNIQUE: Multidetector CT imaging of the abdomen and pelvis was performed using the standard protocol following bolus administration of intravenous contrast. CONTRAST:  ISOVUE-300 IOPAMIDOL (ISOVUE-300) INJECTION 61% COMPARISON:  None. FINDINGS: Lower chest: No acute abnormality. Hepatobiliary: No focal liver abnormality is seen. Status post cholecystectomy. No biliary dilatation. Pancreas: Unremarkable. No pancreatic ductal dilatation or surrounding inflammatory changes. Spleen: Normal in size without focal abnormality. Adrenals/Urinary Tract: Normal adrenal glands. No focal kidney abnormality. No left hydronephrosis. Mild right hydronephrosis without obstructing stone or lesion is likely due to reactive inflammatory changes. Normal bladder. Stomach/Bowel: Enlarged appendix measuring at least 20 mm with enhancing thickened wall, severe extensive inflammatory changes in the mid abdomen and right lower quadrant, and a large appendix wall defect indicating perforation (Series 6, image 54). Fluid surrounding the inflamed appendix likely represents phlegmon. No discrete organized periappendiceal abscess identified at this time. No pneumoperitoneum. Reactive inflammatory changes of surrounding loops of small bowel and the cecum. Vascular/Lymphatic: Mild calcific atherosclerosis of the infrarenal abdominal aorta and bilateral common iliac  artery's. Mesenteric and right lower quadrant lymphadenopathy is likely reactive. Reproductive: Prostate is unremarkable. Other: No abdominal wall hernia or abnormality. No abdominopelvic ascites. Musculoskeletal: No acute or significant osseous findings. IMPRESSION: Perforated  acute appendicitis with severe extensive inflammatory changes in the mid abdomen and right lower quadrant. Fluid surrounding the appendix is likely phlegmon. No discrete rim enhancing periappendiceal abscess identified at this time. These results were called by telephone at the time of interpretation on 04/14/2017 at 2:42 pm to Dr. Jacalyn LefevreJULIE HAVILAND , who verbally acknowledged these results. Electronically Signed   By: Mitzi HansenLance  Furusawa-Stratton M.D.   On: 04/14/2017 14:48    Anti-infectives: Anti-infectives    Start     Dose/Rate Route Frequency Ordered Stop   04/14/17 2200  piperacillin-tazobactam (ZOSYN) IVPB 3.375 g     3.375 g 12.5 mL/hr over 240 Minutes Intravenous Every 8 hours 04/14/17 1537     04/14/17 1500  piperacillin-tazobactam (ZOSYN) IVPB 3.375 g  Status:  Discontinued     3.375 g 100 mL/hr over 30 Minutes Intravenous  Once 04/14/17 1450 04/14/17 1536     Assessment/Plan Perforated appendicitis - afebrile, VSS - RLQ phlegmon on CT - no abscess amenable to drainage at this time - WBC 13.6 from 14.0, continue IV abx; CBC in AM - clear liquid diet - pain control/IS - mobilize as tolerated, may shower - continue with conservative management with IV abx and plan for interval appendectomy. If patient clinically worsens he will likely require urgent appendectomy this admission.   FEN: IVF, clear liquids; hyponatremia (130)  ID: Zosyn 5/22 >> VTE: Lovenox, SCD's    LOS: 1 day    Adam PhenixElizabeth S Zamzam Whinery , [facility] 04/15/2017, 8:12 AM Pager: [phone] Consults: [phone] Mon-Fri 7:00 am-4:30 pm Sat-Sun 7:00 am-11:30 am

## 2017-04-16 LAB — BASIC METABOLIC PANEL
Anion gap: 8 (ref 5–15)
BUN: 9 mg/dL (ref 6–20)
CALCIUM: 8.4 mg/dL — AB (ref 8.9–10.3)
CO2: 25 mmol/L (ref 22–32)
Chloride: 97 mmol/L — ABNORMAL LOW (ref 101–111)
Creatinine, Ser: 1.07 mg/dL (ref 0.61–1.24)
GLUCOSE: 101 mg/dL — AB (ref 65–99)
Potassium: 4.2 mmol/L (ref 3.5–5.1)
Sodium: 130 mmol/L — ABNORMAL LOW (ref 135–145)

## 2017-04-16 LAB — CBC
HEMATOCRIT: 41.9 % (ref 39.0–52.0)
Hemoglobin: 14.1 g/dL (ref 13.0–17.0)
MCH: 29.6 pg (ref 26.0–34.0)
MCHC: 33.7 g/dL (ref 30.0–36.0)
MCV: 87.8 fL (ref 78.0–100.0)
PLATELETS: 230 10*3/uL (ref 150–400)
RBC: 4.77 MIL/uL (ref 4.22–5.81)
RDW: 13.2 % (ref 11.5–15.5)
WBC: 12 10*3/uL — AB (ref 4.0–10.5)

## 2017-04-16 NOTE — Progress Notes (Signed)
[facility] Progress Note     Subjective: CC: perforated appendcitis Abdominal pain improving. Mobilizing better. Had a BM and flatus. Denies nausea or vomiting.  TMAX 102 at 0548  Objective: Vital signs in last 24 hours: Temp:  [97.6 F (36.4 C)-102 F (38.9 C)] 98.1 F (36.7 C) (05/24 0700) Pulse Rate:  [93-95] 95 (05/24 0548) Resp:  [18] 18 (05/24 0548) BP: (131-137)/(66-78) 137/67 (05/24 0548) SpO2:  [91 %-96 %] 96 % (05/24 0548) Last BM Date: 04/14/17  Intake/Output from previous day: 05/23 0701 - 05/24 0700 In: 1570 [P.O.:1320; I.V.:250] Out: -  Intake/Output this shift: No intake/output data recorded.  PE: Gen:  Alert, NAD, pleasant and cooperative Card:  Regular rate and rhythm, pedal pulses 2+ BL Pulm:  Normal effort, clear to auscultation bilaterally Abd: Soft, TTP over RLQ without rebound tenderness or guarding, non-distended, +BS Skin: warm and dry, no rashes  Psych: A&Ox3   Lab Results:   Recent Labs  04/15/17 0516 04/16/17 0302  WBC 13.6* 12.0*  HGB 13.6 14.1  HCT 41.5 41.9  PLT 253 230   BMET  Recent Labs  04/15/17 0516 04/16/17 0302  NA 130* 130*  K 4.7 4.2  CL 97* 97*  CO2 26 25  GLUCOSE 112* 101*  BUN 10 9  CREATININE 1.18 1.07  CALCIUM 8.2* 8.4*   CMP     Component Value Date/Time   NA 130 (L) 04/16/2017 0302   K 4.2 04/16/2017 0302   CL 97 (L) 04/16/2017 0302   CO2 25 04/16/2017 0302   GLUCOSE 101 (H) 04/16/2017 0302   BUN 9 04/16/2017 0302   CREATININE 1.07 04/16/2017 0302   CALCIUM 8.4 (L) 04/16/2017 0302   PROT 7.3 04/14/2017 1233   ALBUMIN 3.8 04/14/2017 1233   AST 37 04/14/2017 1233   ALT 58 04/14/2017 1233   ALKPHOS 94 04/14/2017 1233   BILITOT 1.1 04/14/2017 1233   GFRNONAA >60 04/16/2017 0302   GFRAA >60 04/16/2017 0302   Lipase     Component Value Date/Time   LIPASE 24 04/14/2017 1233       Studies/Results: Ct Abdomen Pelvis W Contrast  Result Date: 04/14/2017 CLINICAL DATA:  46 y/o  M; 2 days of right lower quadrant abdominal pain. EXAM: CT ABDOMEN AND PELVIS WITH CONTRAST TECHNIQUE: Multidetector CT imaging of the abdomen and pelvis was performed using the standard protocol following bolus administration of intravenous contrast. CONTRAST:  100mL ISOVUE-300 IOPAMIDOL (ISOVUE-300) INJECTION 61% COMPARISON:  None. FINDINGS: Lower chest: No acute abnormality. Hepatobiliary: No focal liver abnormality is seen. Status post cholecystectomy. No biliary dilatation. Pancreas: Unremarkable. No pancreatic ductal dilatation or surrounding inflammatory changes. Spleen: Normal in size without focal abnormality. Adrenals/Urinary Tract: Normal adrenal glands. No focal kidney abnormality. No left hydronephrosis. Mild right hydronephrosis without obstructing stone or lesion is likely due to reactive inflammatory changes. Normal bladder. Stomach/Bowel: Enlarged appendix measuring at least 20 mm with enhancing thickened wall, severe extensive inflammatory changes in the mid abdomen and right lower quadrant, and a large appendix wall defect indicating perforation (Series 6, image 54). Fluid surrounding the inflamed appendix likely represents phlegmon. No discrete organized periappendiceal abscess identified at this time. No pneumoperitoneum. Reactive inflammatory changes of surrounding loops of small bowel and the cecum. Vascular/Lymphatic: Mild calcific atherosclerosis of the infrarenal abdominal aorta and bilateral common iliac artery's. Mesenteric and right lower quadrant lymphadenopathy is likely reactive. Reproductive: Prostate is unremarkable. Other: No abdominal wall hernia or abnormality. No abdominopelvic ascites. Musculoskeletal: No acute or significant  osseous findings. IMPRESSION: Perforated acute appendicitis with severe extensive inflammatory changes in the mid abdomen and right lower quadrant. Fluid surrounding the appendix is likely phlegmon. No discrete rim enhancing periappendiceal abscess  identified at this time. These results were called by telephone at the time of interpretation on 04/14/2017 at 2:42 pm to Dr. Jacalyn Lefevre , who verbally acknowledged these results. Electronically Signed   By: Mitzi Hansen M.D.   On: 04/14/2017 14:48    Anti-infectives: Anti-infectives    Start     Dose/Rate Route Frequency Ordered Stop   04/14/17 2200  piperacillin-tazobactam (ZOSYN) IVPB 3.375 g     3.375 g 12.5 mL/hr over 240 Minutes Intravenous Every 8 hours 04/14/17 1537     04/14/17 1500  piperacillin-tazobactam (ZOSYN) IVPB 3.375 g  Status:  Discontinued     3.375 g 100 mL/hr over 30 Minutes Intravenous  Once 04/14/17 1450 04/14/17 1536     Assessment/Plan Perforated appendicitis - febrile, BP and HR stable  - RLQ phlegmon on CT - no abscess amenable to drainage at this time - continue IV abx; CBC in AM - full liquid diet - pain control/IS - mobilize as tolerated, may shower - continue with conservative management with IV abx and plan for interval appendectomy. If patient clinically worsens he will likely require urgent appendectomy this admission.   FEN: IVF, full liquids; hyponatremia (130)  ID: Zosyn 5/22 >>; WBC 12.0 from 13.6; now with fevers VTE: Lovenox, SCD's   Plan: febrile. Leukocytosis improving. Continue IV abx. Advance diet.   LOS: 2 days    Adam Phenix , [facility] 04/16/2017, 7:56 AM Pager: [phone] Consults: [phone] Mon-Fri 7:00 am-4:30 pm Sat-Sun 7:00 am-11:30 am

## 2017-04-17 LAB — CBC
HCT: 36.1 % — ABNORMAL LOW (ref 39.0–52.0)
Hemoglobin: 12 g/dL — ABNORMAL LOW (ref 13.0–17.0)
MCH: 29.2 pg (ref 26.0–34.0)
MCHC: 33.2 g/dL (ref 30.0–36.0)
MCV: 87.8 fL (ref 78.0–100.0)
Platelets: 246 10*3/uL (ref 150–400)
RBC: 4.11 MIL/uL — ABNORMAL LOW (ref 4.22–5.81)
RDW: 13.2 % (ref 11.5–15.5)
WBC: 8.2 10*3/uL (ref 4.0–10.5)

## 2017-04-17 MED ORDER — OXYCODONE HCL 5 MG PO TABS: 5.0000 mg | ORAL_TABLET | ORAL | 0 refills | Status: AC | PRN

## 2017-04-17 MED ORDER — PANTOPRAZOLE SODIUM 40 MG PO TBEC: 40.0000 mg | DELAYED_RELEASE_TABLET | Freq: Every day | ORAL | Status: DC

## 2017-04-17 MED ORDER — AMOXICILLIN-POT CLAVULANATE 875-125 MG PO TABS: 1.0000 | ORAL_TABLET | Freq: Two times a day (BID) | ORAL | 0 refills | Status: AC

## 2017-04-17 NOTE — Progress Notes (Signed)
   Subjective/Chief Complaint: Pt with NAE Some BMs, soft Abd pain better Fevers o/n   Objective: Vital signs in last 24 hours: Temp:  [98.8 F (37.1 C)-102.8 F (39.3 C)] 99.6 F (37.6 C) (05/25 0705) Pulse Rate:  [88-99] 95 (05/25 0503) Resp:  [18] 18 (05/25 0503) BP: (117-124)/(48-63) 124/53 (05/25 0503) SpO2:  [96 %-98 %] 97 % (05/25 0503) Last BM Date: 04/14/17  Intake/Output from previous day: 05/24 0701 - 05/25 0700 In: 1160 [P.O.:1160] Out: -  Intake/Output this shift: No intake/output data recorded.  General appearance: alert and cooperative GI: soft, min ttp RLQ, no guarding/rebound  Lab Results:   Recent Labs  04/16/17 0302 04/17/17 0547  WBC 12.0* 8.2  HGB 14.1 12.0*  HCT 41.9 36.1*  PLT 230 246   BMET  Recent Labs  04/15/17 0516 04/16/17 0302  NA 130* 130*  K 4.7 4.2  CL 97* 97*  CO2 26 25  GLUCOSE 112* 101*  BUN 10 9  CREATININE 1.18 1.07  CALCIUM 8.2* 8.4*  Anti-infectives: Anti-infectives    Start     Dose/Rate Route Frequency Ordered Stop   04/14/17 2200  piperacillin-tazobactam (ZOSYN) IVPB 3.375 g     3.375 g 12.5 mL/hr over 240 Minutes Intravenous Every 8 hours 04/14/17 1537     04/14/17 1500  piperacillin-tazobactam (ZOSYN) IVPB 3.375 g  Status:  Discontinued     3.375 g 100 mL/hr over 30 Minutes Intravenous  Once 04/14/17 1450 04/14/17 1536      Assessment/Plan: Perforated appendicitis - febrile, BP and HR stable  - RLQ phlegmon on CT - no abscess amenable to drainage at this time - continue IV abx; WBC WNL -Soft diet - pain control/IS - mobilize as tolerated, may shower - continue with conservative management with IV abx and plan for interval appendectomy. If patient clinically worsens he will likely require urgent appendectomy this admission.   FEN: IVF, full liquids; hyponatremia (130)  ID: Zosyn 5/22 >>; WBC 12.0 from 13.6; now with fevers VTE: Lovenox, SCD's   Plan:WIll adv diet.  If pt stays afebrile  today, possible DC later this  PM.  If fevers return, may require repeat CT to eval for evolving abscess     LOS: 3 days    Marigene Ehlersamirez Jr., [facility] 04/17/2017

## 2017-04-17 NOTE — Discharge Summary (Signed)
  [facility] Discharge Summary   Patient ID: Chase MonteDavid G Frazier MRN: [id number] DOB/AGE: Dec 05, 1970 46 y.o.  Admit date: 04/14/2017 Discharge date: 04/17/2017  Admitting Diagnosis: Perforated appendicitis  Discharge Diagnosis Patient Active Problem List   Diagnosis Date Noted  . Perforated appendicitis 04/14/2017   Consultants Interventional Radiology - Dr. Loreta AveWagner  Procedures None  Hospital Course:  Patient is a 46 y.o. male who [facility] 7-8 days of RLQ pain.  Workup showed acute perforated appendicitis with phlegmon formation.  Patient was admitted and IR was consulted for RLQ percutaneous drain. On admission patient was started on IV Zosyn. IR did not feel percutaneous drain was needed. Conservative management with IV antibiotics continued throughout admission with plan for interval appendectomy. On 3rd day of admission patient was felt to be clinically improved and WBC count normalized. Patient stable for discharge home on PO Augmentin with planned follow up in 1 month.   Allergies as of 04/17/2017   No Known Allergies     Medication List    TAKE these medications   amoxicillin-clavulanate 875-125 MG tablet Commonly known as:  AUGMENTIN Take 1 tablet by mouth every 12 (twelve) hours.   amphetamine-dextroamphetamine 30 MG tablet Commonly known as:  ADDERALL Take 30 mg by mouth 2 (two) times daily.   cetirizine 10 MG tablet Commonly known as:  ZYRTEC Take 10 mg by mouth daily.   cyclobenzaprine 10 MG tablet Commonly known as:  FLEXERIL Take 10 mg by mouth 3 (three) times daily as needed for muscle spasms.   LORazepam 0.5 MG tablet Commonly known as:  ATIVAN Take 0.5 mg by mouth daily as needed for anxiety.   montelukast 10 MG tablet Commonly known as:  SINGULAIR Take 10 mg by mouth at bedtime.   omeprazole 40 MG capsule Commonly known as:  PRILOSEC Take 40 mg by mouth daily.   oxyCODONE 5 MG immediate release tablet Commonly known  as:  Oxy IR/ROXICODONE Take 1-2 tablets (5-10 mg total) by mouth every 4 (four) hours as needed for moderate pain or severe pain.   testosterone cypionate 100 MG/ML injection Commonly known as:  DEPOTESTOTERONE CYPIONATE Inject 200 mg into the muscle every 7 (seven) days. For IM use only   zolpidem 12.5 MG CR tablet Commonly known as:  AMBIEN CR Take 12.5 mg by mouth at bedtime as needed for sleep.        Follow-up Information    Axel Filleramirez, Armando, MD. Call.   Specialty:  General Surgery Why:  You will need to see Dr. Derrell Lollingamirez in about 4 weeks for follow up. Please arrive 30 min prior to appointment time. Bring photo ID and insurance information. Contact information: [address] [place] [address] [phone]          I have personally looked this patient up in the National Controlled Substance Database and reviewed their medications.  Signed: Wells GuilesKelly Rayburn, [facility] 04/17/2017, 3:19 PM Pager: [phone] Consults: [phone] Mon-Fri 7:00 am-4:30 pm Sat-Sun 7:00 am-11:30 am

## 2017-04-17 NOTE — Progress Notes (Signed)
Discharge home. Home discharge instruction given, no question verbalized. 

## 2017-04-17 NOTE — Discharge Instructions (Signed)
Appendicitis  The appendix is a tube that is shaped like a finger. It is connected to the large intestine. Appendicitis means that this tube is swollen (inflamed). Without treatment, the tube can tear (rupture). This can lead to a life-threatening infection. It can also cause you to have sores (abscesses). These sores hurt.  What are the causes?  This condition may be caused by something that blocks the appendix, such as:  · A ball of poop (stool).  · Lymph glands that are bigger than normal.    Sometimes, the cause is not known.  What are the signs or symptoms?  Symptoms of this condition include:  · Pain around the belly button (navel).  ? The pain moves toward the lower right belly (abdomen).  ? The pain can get worse with time.  ? The pain can get worse if you cough.  ? The pain can get worse if you move suddenly.  · Tenderness in the lower right belly.  · Feeling sick to your stomach (nauseous).  · Throwing up (vomiting).  · Not feeling hungry (loss of appetite).  · A fever.  · Having a hard time pooping (constipation).  · Watery poop (diarrhea).  · Not feeling well.    How is this treated?  Usually, this condition is treated by taking out the appendix (appendectomy). There are two ways that the appendix can be taken out:  · Open surgery. In this surgery, the appendix is taken out through a large cut (incision). The cut is made in the lower right belly. This surgery may be picked if:  ? You have scars from another surgery.  ? You have a bleeding condition.  ? You are pregnant and will be having your baby soon.  ? You have a condition that does not allow the other type of surgery.  · Laparoscopic surgery. In this surgery, the appendix is taken out through small cuts. Often, this surgery:  ? Causes less pain.  ? Causes fewer problems.  ? Is easier to heal from.    If your appendix tears and a sore forms:  · A drain may be put into the sore. The drain will be used to get rid of fluid.  · You may get an antibiotic  medicine through an IV tube.  · Your appendix may or may not need to be taken out.    This information is not intended to replace advice given to you by your health care provider. Make sure you discuss any questions you have with your health care provider.  Document Released: 02/02/2012 Document Revised: 04/17/2016 Document Reviewed: 03/28/2015  Elsevier Interactive Patient Education © 2017 [employer] Inc[employer]

## 2017-04-17 NOTE — Care Management Note (Signed)
Case Management Note  Patient Details  Name: Chase Frazier MRN: [id number] Date of Birth: January 30, 1971  Subjective/Objective:                    Action/Plan: Pt d/cing home with self care. Pt has insurance and PCP. He is from home with his spouse. No further needs per CM.   Expected Discharge Date:  04/17/17               Expected Discharge Plan:  Home/Self Care  In-House Referral:     Discharge planning Services     Post Acute Care Choice:    Choice offered to:     DME Arranged:    DME Agency:     HH Arranged:    HH Agency:     Status of Service:  Completed, signed off  If discussed at [employer] Length of Stay Meetings, dates discussed:    Additional Comments:  Kermit BaloKelli F Alin Hutchins, RN 04/17/2017, 3:29 PM

## 2017-04-24 DIAGNOSIS — Z6834 Body mass index (BMI) 34.0-34.9, adult: Secondary | ICD-10-CM | POA: Diagnosis not present

## 2017-04-24 DIAGNOSIS — K352 Acute appendicitis with generalized peritonitis: Secondary | ICD-10-CM | POA: Diagnosis not present

## 2017-05-04 DIAGNOSIS — K352 Acute appendicitis with generalized peritonitis: Secondary | ICD-10-CM | POA: Diagnosis not present

## 2017-05-05 ENCOUNTER — Other Ambulatory Visit: Payer: Self-pay | Admitting: General Surgery

## 2017-05-05 DIAGNOSIS — K3532 Acute appendicitis with perforation and localized peritonitis, without abscess: Secondary | ICD-10-CM

## 2017-05-15 ENCOUNTER — Ambulatory Visit
Admission: RE | Admit: 2017-05-15 | Discharge: 2017-05-15 | Disposition: A | Discharge status: Home / Self Care | Payer: BLUE CROSS/BLUE SHIELD | Source: Ambulatory Visit | Attending: General Surgery | Admitting: General Surgery

## 2017-05-15 DIAGNOSIS — K3532 Acute appendicitis with perforation and localized peritonitis, without abscess: Secondary | ICD-10-CM

## 2017-05-15 DIAGNOSIS — N132 Hydronephrosis with renal and ureteral calculous obstruction: Secondary | ICD-10-CM | POA: Diagnosis not present

## 2017-05-15 MED ORDER — IOPAMIDOL (ISOVUE-300) INJECTION 61%
125.0000 mL | Freq: Once | INTRAVENOUS | Status: AC | PRN
  Administered 2017-05-15: 125 mL via INTRAVENOUS

## 2017-06-29 DIAGNOSIS — K352 Acute appendicitis with generalized peritonitis: Secondary | ICD-10-CM | POA: Diagnosis not present

## 2017-07-31 DIAGNOSIS — K352 Acute appendicitis with generalized peritonitis: Secondary | ICD-10-CM | POA: Diagnosis not present

## 2017-08-05 ENCOUNTER — Ambulatory Visit: Payer: Self-pay | Admitting: General Surgery

## 2017-08-05 NOTE — H&P (Signed)
History of Present Illness Axel Filler(Derrika Ruffalo MD; 07/31/2017 4:31 PM) The patient is a 46 year old male who presents with appendicitis. 46 year old male with a ruptured appendicitis that is been treated nonoperatively. Patient has had some recurring right lower quadrant abdominal pain. He states better more times than not. He states his burning sensation. He states it is been better recently. He states he has not had a fever. He's had no signs or symptoms of sepsis, diarrhea, has some constipation but states this is due to his diet.    Review of Systems Axel Filler(Erikson Danzy, MD; 07/31/2017 4:32 PM) General Present- Appetite Loss, Fatigue, Fever and Night Sweats. Not Present- Chills, Weight Gain and Weight Loss. Skin Not Present- Change in Wart/Mole, Dryness, Hives, Jaundice, New Lesions, Non-Healing Wounds, Rash and Ulcer. HEENT Present- Seasonal Allergies, Sinus Pain and Wears glasses/contact lenses. Not Present- Earache, Hearing Loss, Hoarseness, Nose Bleed, Oral Ulcers, Ringing in the Ears, Sore Throat, Visual Disturbances and Yellow Eyes. Breast Not Present- Breast Mass, Breast Pain, Nipple Discharge and Skin Changes. Cardiovascular Not Present- Chest Pain, Difficulty Breathing Lying Down, Leg Cramps, Palpitations, Rapid Heart Rate, Shortness of Breath and Swelling of Extremities. Gastrointestinal Present- Abdominal Pain, Constipation and Nausea. Not Present- Bloating, Bloody Stool, Change in Bowel Habits, Chronic diarrhea, Difficulty Swallowing, Excessive gas, Gets full quickly at meals, Hemorrhoids, Indigestion, Rectal Pain and Vomiting. Male Genitourinary Not Present- Blood in Urine, Change in Urinary Stream, Frequency, Impotence, Nocturia, Painful Urination, Urgency and Urine Leakage. Neurological Present- Weakness. Not Present- Decreased Memory, Fainting, Headaches, Numbness, Seizures, Tingling, Tremor and Trouble walking. Psychiatric Not Present- Anxiety, Bipolar, Change in Sleep Pattern,  Depression, Fearful and Frequent crying. Endocrine Present- Heat Intolerance. Not Present- Cold Intolerance, Excessive Hunger, Hair Changes and New Diabetes. Hematology Not Present- Blood Thinners, Easy Bruising, Excessive bleeding, Gland problems, HIV and Persistent Infections.   Physical Exam Axel Filler(Rana Hochstein, MD; 07/31/2017 4:32 PM) General Mental Status-Alert. General Appearance-Consistent with stated age. Hydration-Well hydrated. Voice-Normal.  Chest and Lung Exam Chest and lung exam reveals -quiet, even and easy respiratory effort with no use of accessory muscles and on auscultation, normal breath sounds, no adventitious sounds and normal vocal resonance. Inspection Chest Wall - Normal. Back - normal.  Cardiovascular Cardiovascular examination reveals -normal heart sounds, regular rate and rhythm with no murmurs and normal pedal pulses bilaterally.  Abdomen Inspection Normal Exam - No Hernias. Skin - Scar - no surgical scars. Palpation/Percussion Normal exam - Soft, Non Tender, No Rebound tenderness, No Rigidity (guarding) and No hepatosplenomegaly. Auscultation Normal exam - Bowel sounds normal.  Neurologic Neurologic evaluation reveals -alert and oriented x 3 with no impairment of recent or remote memory. Mental Status-Normal.  Musculoskeletal Normal Exam - Left-Upper Extremity Strength Normal and Lower Extremity Strength Normal. Normal Exam - Right-Upper Extremity Strength Normal and Lower Extremity Strength Normal.    Assessment & Plan Axel Filler(Zoe Nordin MD; 07/31/2017 4:32 PM) RUPTURED APPENDICITIS (K35.2) Impression: 46 year old male with a history of ruptured appendicitis treated nonoperatively in the hospital secondary to phlegmon formation. 1. The patient would like to proceed with surgery.  2  I Did discuss with him the risks and benefits of the procedure to include but not limited to: Infection, bleeding, damage to structures, possible need  for ostomy, possible colon and/or small bowel resection. The patient was understanding and wishes to proceed

## 2017-08-31 ENCOUNTER — Encounter (HOSPITAL_COMMUNITY): Payer: Self-pay

## 2017-08-31 ENCOUNTER — Encounter (HOSPITAL_COMMUNITY)
Admission: RE | Admit: 2017-08-31 | Discharge: 2017-08-31 | Disposition: A | Discharge status: Home / Self Care | Payer: BLUE CROSS/BLUE SHIELD | Source: Ambulatory Visit | Attending: General Surgery | Admitting: General Surgery

## 2017-08-31 ENCOUNTER — Other Ambulatory Visit (HOSPITAL_COMMUNITY): Payer: Self-pay | Admitting: *Deleted

## 2017-08-31 DIAGNOSIS — F419 Anxiety disorder, unspecified: Secondary | ICD-10-CM | POA: Diagnosis not present

## 2017-08-31 DIAGNOSIS — E669 Obesity, unspecified: Secondary | ICD-10-CM | POA: Diagnosis not present

## 2017-08-31 DIAGNOSIS — K3532 Acute appendicitis with perforation and localized peritonitis, without abscess: Secondary | ICD-10-CM | POA: Diagnosis not present

## 2017-08-31 DIAGNOSIS — M1990 Unspecified osteoarthritis, unspecified site: Secondary | ICD-10-CM | POA: Diagnosis not present

## 2017-08-31 DIAGNOSIS — Z87891 Personal history of nicotine dependence: Secondary | ICD-10-CM | POA: Diagnosis not present

## 2017-08-31 DIAGNOSIS — Z6834 Body mass index (BMI) 34.0-34.9, adult: Secondary | ICD-10-CM | POA: Diagnosis not present

## 2017-08-31 DIAGNOSIS — K219 Gastro-esophageal reflux disease without esophagitis: Secondary | ICD-10-CM | POA: Diagnosis not present

## 2017-08-31 HISTORY — DX: Gastro-esophageal reflux disease without esophagitis: K21.9

## 2017-08-31 HISTORY — DX: Unspecified osteoarthritis, unspecified site: M19.90

## 2017-08-31 LAB — CBC
HEMATOCRIT: 43.1 % (ref 39.0–52.0)
HEMOGLOBIN: 14.6 g/dL (ref 13.0–17.0)
MCH: 29 pg (ref 26.0–34.0)
MCHC: 33.9 g/dL (ref 30.0–36.0)
MCV: 85.7 fL (ref 78.0–100.0)
PLATELETS: 198 10*3/uL (ref 150–400)
RBC: 5.03 MIL/uL (ref 4.22–5.81)
RDW: 12.7 % (ref 11.5–15.5)
WBC: 7 10*3/uL (ref 4.0–10.5)

## 2017-08-31 NOTE — Pre-Procedure Instructions (Signed)
Chase Frazier  08/31/2017      [facility] - [place], [place] - [address] AT [facility] OF [address] & [address] [place] [address] Phone: [phone] Fax: [phone]  [facility] - [address] [place], [place] - [address] AT [facility] OF [address] [place] [address] Phone: [phone] Fax: [phone]    Your procedure is scheduled on 09-03-2017  Thursday .  Report to [facility] Admitting at 5:30 A.M.   Call this number if you have problems the morning of surgery:  [phone]   Remember:  Do not eat food or drink liquids after midnight.   Take these medicines the morning of surgery with A SIP OF WATER Adderall, Zyrtec if needed,cyclobenzaprine(Flexeril) if needed,Lorazepam(Ativan) if needed,omeprazole(Prilosec),oxycodone if needed   STOP ASPIRIN,ANTIINFLAMATORIES (IBUPROFEN,ALEVE,MOTRIN,ADVIL,GOODY'S POWDERS),HERBAL SUPPLEMENTS,FISH OIL,AND VITAMINS 5-7 DAYS PRIOR TO SURGERY   Do not wear jewelry, make-up or nail polish.  Do not wear lotions, powders, or perfumes, or deoderant.  Do not shave 48 hours prior to surgery.  Men may shave face and neck.  Do not bring valuables to the hospital.  [facility] is not responsible for any belongings or valuables.  Contacts, dentures or bridgework may not be worn into surgery.  Leave your suitcase in the car.  After surgery it may be brought to your room.  For patients admitted to the hospital, discharge time will be determined by your treatment team.  Patients discharged the day of surgery will not be allowed to drive home.    Special Instructions: [place] - Preparing for Surgery  Before surgery, you can play an important role.  Because skin is not sterile, your skin needs to be as free of germs as possible.  You can reduce the number of germs on you skin by washing with CHG (chlorahexidine gluconate) soap before surgery.   CHG is an antiseptic cleaner which kills germs and bonds with the skin to continue killing germs even after washing.  Please DO NOT use if you have an allergy to CHG or antibacterial soaps.  If your skin becomes reddened/irritated stop using the CHG and inform your nurse when you arrive at Short Stay.  Do not shave (including legs and underarms) for at least 48 hours prior to the first CHG shower.  You may shave your face.  Please follow these instructions carefully:   1.  Shower with CHG Soap the night before surgery and the   morning of Surgery.  2.  If you choose to wash your hair, wash your hair first as usual with your normal shampoo.  3.  After you shampoo, rinse your hair and body thoroughly to remove the  Shampoo.  4.  Use CHG as you would any other liquid soap.  You can apply chg directly  to the skin and wash gently with scrungie or a clean washcloth.  5.  Apply the CHG Soap to your body ONLY FROM THE NECK DOWN.   Do not use on open wounds or open sores.  Avoid contact with your eyes,  ears, mouth and genitals (private parts).  Wash genitals (private parts) with your normal soap.  6.  Wash thoroughly, paying special attention to the area where your surgery will be performed.  7.  Thoroughly rinse your body with warm water from the neck down.  8.  DO NOT shower/wash with your normal  soap after using and rinsing o  the CHG Soap.  9.  Pat yourself dry with a clean towel.            10.  Wear clean pajamas.            11.  Place clean sheets on your bed the night of your first shower and do not sleep with pets.  Day of Surgery  Do not apply any lotions/deodorants the morning of surgery.  Please wear clean clothes to the hospital/surgery center.   Please read over the following fact sheets that you were given. Coughing and Deep Breathing and Surgical Site Infection Prevention

## 2017-08-31 NOTE — Progress Notes (Signed)
PCP Lonie Peak PA-C  No Cardiologist Denies any heart studies No complaint of chest pain or discomfort

## 2017-09-02 NOTE — Anesthesia Preprocedure Evaluation (Addendum)
Anesthesia Evaluation  Patient identified by MRN, date of birth, ID band Patient awake    Reviewed: Allergy & Precautions, NPO status , Patient's Chart, lab work & pertinent test results  Airway Mallampati: II  TM Distance: <3 FB Neck ROM: Full    Dental  (+) Dental Advisory Given, Poor Dentition, Missing   Pulmonary former smoker,    Pulmonary exam normal breath sounds clear to auscultation       Cardiovascular Exercise Tolerance: Good negative cardio ROS Normal cardiovascular exam Rhythm:Regular Rate:Normal     Neuro/Psych Anxiety negative neurological ROS     GI/Hepatic Neg liver ROS, GERD  Controlled and Medicated,  Endo/Other  Obesity  Renal/GU negative Renal ROS  negative genitourinary   Musculoskeletal  (+) Arthritis ,   Abdominal   Peds  Hematology negative hematology ROS (+)   Anesthesia Other Findings   Reproductive/Obstetrics                            Anesthesia Physical Anesthesia Plan  ASA: II  Anesthesia Plan: General   Post-op Pain Management:    Induction: Intravenous  PONV Risk Score and Plan: 4 or greater and Ondansetron, Dexamethasone, Midazolam, Scopolamine patch - Pre-op and Treatment may vary due to age or medical condition  Airway Management Planned: Oral ETT  Additional Equipment: None  Intra-op Plan:   Post-operative Plan: Extubation in OR  Informed Consent: I have reviewed the patients History and Physical, chart, labs and discussed the procedure including the risks, benefits and alternatives for the proposed anesthesia with the patient or authorized representative who has indicated his/her understanding and acceptance.   Dental advisory given  Plan Discussed with: CRNA  Anesthesia Plan Comments:         Anesthesia Quick Evaluation

## 2017-09-03 ENCOUNTER — Ambulatory Visit (HOSPITAL_COMMUNITY): Payer: BLUE CROSS/BLUE SHIELD | Admitting: Anesthesiology

## 2017-09-03 ENCOUNTER — Ambulatory Visit (HOSPITAL_COMMUNITY)
Admission: RE | Admit: 2017-09-03 | Discharge: 2017-09-03 | Disposition: A | Discharge status: Home / Self Care | Payer: BLUE CROSS/BLUE SHIELD | Source: Ambulatory Visit | Attending: General Surgery | Admitting: General Surgery

## 2017-09-03 ENCOUNTER — Encounter (HOSPITAL_COMMUNITY)
Admission: RE | Disposition: A | Discharge status: Home / Self Care | Payer: Self-pay | Source: Ambulatory Visit | Attending: General Surgery

## 2017-09-03 ENCOUNTER — Encounter (HOSPITAL_COMMUNITY): Payer: Self-pay | Admitting: *Deleted

## 2017-09-03 DIAGNOSIS — K3532 Acute appendicitis with perforation and localized peritonitis, without abscess: Secondary | ICD-10-CM | POA: Insufficient documentation

## 2017-09-03 DIAGNOSIS — Z6834 Body mass index (BMI) 34.0-34.9, adult: Secondary | ICD-10-CM | POA: Diagnosis not present

## 2017-09-03 DIAGNOSIS — E669 Obesity, unspecified: Secondary | ICD-10-CM | POA: Diagnosis not present

## 2017-09-03 DIAGNOSIS — K36 Other appendicitis: Secondary | ICD-10-CM | POA: Diagnosis not present

## 2017-09-03 DIAGNOSIS — K3580 Unspecified acute appendicitis: Secondary | ICD-10-CM | POA: Diagnosis not present

## 2017-09-03 DIAGNOSIS — M1990 Unspecified osteoarthritis, unspecified site: Secondary | ICD-10-CM | POA: Insufficient documentation

## 2017-09-03 DIAGNOSIS — Z87891 Personal history of nicotine dependence: Secondary | ICD-10-CM | POA: Insufficient documentation

## 2017-09-03 DIAGNOSIS — F419 Anxiety disorder, unspecified: Secondary | ICD-10-CM | POA: Insufficient documentation

## 2017-09-03 DIAGNOSIS — K219 Gastro-esophageal reflux disease without esophagitis: Secondary | ICD-10-CM | POA: Insufficient documentation

## 2017-09-03 HISTORY — PX: LAPAROSCOPIC APPENDECTOMY: SHX408

## 2017-09-03 SURGERY — APPENDECTOMY, LAPAROSCOPIC
Anesthesia: General | Site: Abdomen

## 2017-09-03 MED ORDER — PROMETHAZINE HCL 25 MG/ML IJ SOLN: 6.2500 mg | INTRAMUSCULAR | Status: DC | PRN

## 2017-09-03 MED ORDER — PHENYLEPHRINE 40 MCG/ML (10ML) SYRINGE FOR IV PUSH (FOR BLOOD PRESSURE SUPPORT)
PREFILLED_SYRINGE | INTRAVENOUS | Status: AC
  Filled 2017-09-03: qty 10

## 2017-09-03 MED ORDER — GABAPENTIN 300 MG PO CAPS
300.0000 mg | ORAL_CAPSULE | ORAL | Status: AC
  Administered 2017-09-03: 300 mg via ORAL

## 2017-09-03 MED ORDER — PROPOFOL 10 MG/ML IV BOLUS
INTRAVENOUS | Status: AC
  Filled 2017-09-03: qty 20

## 2017-09-03 MED ORDER — SUGAMMADEX SODIUM 200 MG/2ML IV SOLN
INTRAVENOUS | Status: AC
  Filled 2017-09-03: qty 2

## 2017-09-03 MED ORDER — SUGAMMADEX SODIUM 200 MG/2ML IV SOLN
INTRAVENOUS | Status: DC | PRN
  Administered 2017-09-03: 200 mg via INTRAVENOUS

## 2017-09-03 MED ORDER — MIDAZOLAM HCL 5 MG/5ML IJ SOLN
INTRAMUSCULAR | Status: DC | PRN
  Administered 2017-09-03: 2 mg via INTRAVENOUS

## 2017-09-03 MED ORDER — FENTANYL CITRATE (PF) 100 MCG/2ML IJ SOLN
INTRAMUSCULAR | Status: DC | PRN
Start: 2017-09-03 — End: 2017-09-03
  Administered 2017-09-03: 100 ug via INTRAVENOUS
  Administered 2017-09-03: 50 ug via INTRAVENOUS
  Administered 2017-09-03: 100 ug via INTRAVENOUS
  Administered 2017-09-03: 50 ug via INTRAVENOUS

## 2017-09-03 MED ORDER — ONDANSETRON HCL 4 MG/2ML IJ SOLN
INTRAMUSCULAR | Status: DC | PRN
  Administered 2017-09-03: 4 mg via INTRAVENOUS

## 2017-09-03 MED ORDER — FENTANYL CITRATE (PF) 100 MCG/2ML IJ SOLN
25.0000 ug | INTRAMUSCULAR | Status: DC | PRN
  Administered 2017-09-03: 50 ug via INTRAVENOUS

## 2017-09-03 MED ORDER — CEFOTETAN DISODIUM-DEXTROSE 2-2.08 GM-% IV SOLR
2.0000 g | INTRAVENOUS | Status: AC
  Administered 2017-09-03: 2 g via INTRAVENOUS

## 2017-09-03 MED ORDER — BUPIVACAINE HCL 0.25 % IJ SOLN
INTRAMUSCULAR | Status: DC | PRN
  Administered 2017-09-03: 5 mL

## 2017-09-03 MED ORDER — MIDAZOLAM HCL 2 MG/2ML IJ SOLN
INTRAMUSCULAR | Status: AC
  Filled 2017-09-03: qty 2

## 2017-09-03 MED ORDER — CELECOXIB 200 MG PO CAPS
ORAL_CAPSULE | ORAL | Status: AC
  Filled 2017-09-03: qty 1

## 2017-09-03 MED ORDER — FENTANYL CITRATE (PF) 100 MCG/2ML IJ SOLN
INTRAMUSCULAR | Status: AC
  Filled 2017-09-03: qty 2

## 2017-09-03 MED ORDER — LACTATED RINGERS IV SOLN
INTRAVENOUS | Status: DC | PRN
  Administered 2017-09-03 (×2): via INTRAVENOUS

## 2017-09-03 MED ORDER — LIDOCAINE 2% (20 MG/ML) 5 ML SYRINGE
INTRAMUSCULAR | Status: DC | PRN
  Administered 2017-09-03: 40 mg via INTRAVENOUS

## 2017-09-03 MED ORDER — CELECOXIB 200 MG PO CAPS
200.0000 mg | ORAL_CAPSULE | ORAL | Status: AC
  Administered 2017-09-03: 200 mg via ORAL

## 2017-09-03 MED ORDER — DEXAMETHASONE SODIUM PHOSPHATE 10 MG/ML IJ SOLN
INTRAMUSCULAR | Status: AC
  Filled 2017-09-03: qty 1

## 2017-09-03 MED ORDER — 0.9 % SODIUM CHLORIDE (POUR BTL) OPTIME
TOPICAL | Status: DC | PRN
  Administered 2017-09-03: 1000 mL

## 2017-09-03 MED ORDER — PROPOFOL 10 MG/ML IV BOLUS
INTRAVENOUS | Status: DC | PRN
  Administered 2017-09-03: 200 mg via INTRAVENOUS

## 2017-09-03 MED ORDER — SODIUM CHLORIDE 0.9 % IR SOLN
Status: DC | PRN
  Administered 2017-09-03: 1000 mL

## 2017-09-03 MED ORDER — EPHEDRINE 5 MG/ML INJ
INTRAVENOUS | Status: AC
  Filled 2017-09-03: qty 10

## 2017-09-03 MED ORDER — ACETAMINOPHEN 500 MG PO TABS
1000.0000 mg | ORAL_TABLET | ORAL | Status: AC
  Administered 2017-09-03: 1000 mg via ORAL

## 2017-09-03 MED ORDER — FENTANYL CITRATE (PF) 250 MCG/5ML IJ SOLN
INTRAMUSCULAR | Status: AC
  Filled 2017-09-03: qty 5

## 2017-09-03 MED ORDER — CHLORHEXIDINE GLUCONATE CLOTH 2 % EX PADS: 6.0000 | MEDICATED_PAD | Freq: Once | CUTANEOUS | Status: DC

## 2017-09-03 MED ORDER — CEFOTETAN DISODIUM-DEXTROSE 2-2.08 GM-% IV SOLR
INTRAVENOUS | Status: AC
  Filled 2017-09-03: qty 50

## 2017-09-03 MED ORDER — DEXAMETHASONE SODIUM PHOSPHATE 10 MG/ML IJ SOLN
INTRAMUSCULAR | Status: DC | PRN
  Administered 2017-09-03: 8 mg via INTRAVENOUS

## 2017-09-03 MED ORDER — LIDOCAINE 2% (20 MG/ML) 5 ML SYRINGE
INTRAMUSCULAR | Status: AC
  Filled 2017-09-03: qty 5

## 2017-09-03 MED ORDER — BUPIVACAINE HCL (PF) 0.25 % IJ SOLN
INTRAMUSCULAR | Status: AC
  Filled 2017-09-03: qty 30

## 2017-09-03 MED ORDER — ROCURONIUM BROMIDE 100 MG/10ML IV SOLN
INTRAVENOUS | Status: DC | PRN
  Administered 2017-09-03: 10 mg via INTRAVENOUS
  Administered 2017-09-03: 50 mg via INTRAVENOUS

## 2017-09-03 MED ORDER — ONDANSETRON HCL 4 MG/2ML IJ SOLN
INTRAMUSCULAR | Status: AC
  Filled 2017-09-03: qty 2

## 2017-09-03 MED ORDER — OXYCODONE-ACETAMINOPHEN 5-325 MG PO TABS: 1.0000 | ORAL_TABLET | Freq: Four times a day (QID) | ORAL | 0 refills | Status: AC | PRN

## 2017-09-03 MED ORDER — GABAPENTIN 300 MG PO CAPS
ORAL_CAPSULE | ORAL | Status: AC
  Filled 2017-09-03: qty 1

## 2017-09-03 MED ORDER — ACETAMINOPHEN 500 MG PO TABS
ORAL_TABLET | ORAL | Status: AC
  Filled 2017-09-03: qty 2

## 2017-09-03 SURGICAL SUPPLY — 75 items
APL SKNCLS STERI-STRIP NONHPOA (GAUZE/BANDAGES/DRESSINGS) ×2
APPLIER CLIP 5 13 M/L LIGAMAX5 (MISCELLANEOUS)
APR CLP MED LRG 5 ANG JAW (MISCELLANEOUS)
BENZOIN TINCTURE PRP APPL 2/3 (GAUZE/BANDAGES/DRESSINGS) ×4 IMPLANT
BLADE CLIPPER SURG (BLADE) ×6 IMPLANT
CANISTER SUCT 3000ML PPV (MISCELLANEOUS) ×4 IMPLANT
CHLORAPREP W/TINT 26ML (MISCELLANEOUS) ×4 IMPLANT
CLIP APPLIE 5 13 M/L LIGAMAX5 (MISCELLANEOUS) IMPLANT
CLOSURE WOUND 1/2 X4 (GAUZE/BANDAGES/DRESSINGS) ×1
CONT SPEC 4OZ CLIKSEAL STRL BL (MISCELLANEOUS) ×3 IMPLANT
COVER SURGICAL LIGHT HANDLE (MISCELLANEOUS) ×4 IMPLANT
COVER TRANSDUCER ULTRASND (DRAPES) ×4 IMPLANT
DRAPE LAPAROSCOPIC ABDOMINAL (DRAPES) ×1 IMPLANT
DRAPE WARM FLUID 44X44 (DRAPE) ×1 IMPLANT
DRSG OPSITE POSTOP 4X10 (GAUZE/BANDAGES/DRESSINGS) IMPLANT
DRSG OPSITE POSTOP 4X8 (GAUZE/BANDAGES/DRESSINGS) IMPLANT
ELECT BLADE 6.5 EXT (BLADE) IMPLANT
ELECT CAUTERY BLADE 6.4 (BLADE) ×1 IMPLANT
ELECT REM PT RETURN 9FT ADLT (ELECTROSURGICAL) ×4
ELECTRODE REM PT RTRN 9FT ADLT (ELECTROSURGICAL) ×2 IMPLANT
ENDOLOOP SUT PDS II  0 18 (SUTURE) ×6
ENDOLOOP SUT PDS II 0 18 (SUTURE) ×6 IMPLANT
GAUZE SPONGE 2X2 8PLY STRL LF (GAUZE/BANDAGES/DRESSINGS) ×2 IMPLANT
GLOVE BIO SURGEON STRL SZ 6.5 (GLOVE) ×4 IMPLANT
GLOVE BIO SURGEON STRL SZ7 (GLOVE) ×3 IMPLANT
GLOVE BIO SURGEON STRL SZ7.5 (GLOVE) ×7 IMPLANT
GLOVE BIO SURGEONS STRL SZ 6.5 (GLOVE) ×2
GLOVE BIOGEL PI IND STRL 6.5 (GLOVE) ×1 IMPLANT
GLOVE BIOGEL PI IND STRL 7.0 (GLOVE) ×1 IMPLANT
GLOVE BIOGEL PI IND STRL 7.5 (GLOVE) ×1 IMPLANT
GLOVE BIOGEL PI IND STRL 8 (GLOVE) ×2 IMPLANT
GLOVE BIOGEL PI INDICATOR 6.5 (GLOVE) ×2
GLOVE BIOGEL PI INDICATOR 7.0 (GLOVE) ×2
GLOVE BIOGEL PI INDICATOR 7.5 (GLOVE) ×2
GLOVE BIOGEL PI INDICATOR 8 (GLOVE) ×2
GLOVE ECLIPSE 6.5 STRL STRAW (GLOVE) ×3 IMPLANT
GOWN STRL REUS W/ TWL LRG LVL3 (GOWN DISPOSABLE) ×5 IMPLANT
GOWN STRL REUS W/ TWL XL LVL3 (GOWN DISPOSABLE) ×2 IMPLANT
GOWN STRL REUS W/TWL LRG LVL3 (GOWN DISPOSABLE) ×12
GOWN STRL REUS W/TWL XL LVL3 (GOWN DISPOSABLE) ×4
GRASPER SUT TROCAR 14GX15 (MISCELLANEOUS) ×4 IMPLANT
KIT BASIN OR (CUSTOM PROCEDURE TRAY) ×4 IMPLANT
KIT ROOM TURNOVER OR (KITS) ×4 IMPLANT
LIGASURE IMPACT 36 18CM CVD LR (INSTRUMENTS) IMPLANT
NDL INSUFFLATION 14GA 120MM (NEEDLE) ×1 IMPLANT
NEEDLE INSUFFLATION 14GA 120MM (NEEDLE) ×4 IMPLANT
NS IRRIG 1000ML POUR BTL (IV SOLUTION) ×5 IMPLANT
PACK GENERAL/GYN (CUSTOM PROCEDURE TRAY) ×7 IMPLANT
PAD ARMBOARD 7.5X6 YLW CONV (MISCELLANEOUS) ×8 IMPLANT
SCISSORS LAP 5X35 DISP (ENDOMECHANICALS) ×4 IMPLANT
SET IRRIG TUBING LAPAROSCOPIC (IRRIGATION / IRRIGATOR) ×4 IMPLANT
SLEEVE ENDOPATH XCEL 5M (ENDOMECHANICALS) ×4 IMPLANT
SPECIMEN JAR LARGE (MISCELLANEOUS) IMPLANT
SPECIMEN JAR SMALL (MISCELLANEOUS) ×1 IMPLANT
SPONGE GAUZE 2X2 STER 10/PKG (GAUZE/BANDAGES/DRESSINGS) ×2
SPONGE LAP 18X18 X RAY DECT (DISPOSABLE) IMPLANT
STAPLER VISISTAT 35W (STAPLE) ×1 IMPLANT
STRIP CLOSURE SKIN 1/2X4 (GAUZE/BANDAGES/DRESSINGS) ×3 IMPLANT
SUCTION POOLE TIP (SUCTIONS) ×1 IMPLANT
SUT MNCRL AB 4-0 PS2 18 (SUTURE) ×4 IMPLANT
SUT PDS AB 1 TP1 96 (SUTURE) ×2 IMPLANT
SUT SILK 2 0 SH CR/8 (SUTURE) ×1 IMPLANT
SUT SILK 2 0 TIES 10X30 (SUTURE) ×1 IMPLANT
SUT SILK 3 0 SH 30 (SUTURE) ×3 IMPLANT
SUT SILK 3 0 SH CR/8 (SUTURE) ×1 IMPLANT
SUT SILK 3 0 TIES 10X30 (SUTURE) ×1 IMPLANT
TOWEL OR 17X24 6PK STRL BLUE (TOWEL DISPOSABLE) ×4 IMPLANT
TOWEL OR 17X26 10 PK STRL BLUE (TOWEL DISPOSABLE) ×1 IMPLANT
TRAY FOLEY CATH SILVER 16FR (SET/KITS/TRAYS/PACK) ×4 IMPLANT
TRAY FOLEY W/METER SILVER 16FR (SET/KITS/TRAYS/PACK) ×1 IMPLANT
TRAY LAPAROSCOPIC MC (CUSTOM PROCEDURE TRAY) ×4 IMPLANT
TROCAR XCEL NON-BLD 11X100MML (ENDOMECHANICALS) ×4 IMPLANT
TROCAR XCEL NON-BLD 5MMX100MML (ENDOMECHANICALS) ×4 IMPLANT
TUBING INSUFFLATION (TUBING) ×4 IMPLANT
YANKAUER SUCT BULB TIP NO VENT (SUCTIONS) IMPLANT

## 2017-09-03 NOTE — Progress Notes (Signed)
Report given to jada rn as caregiver 

## 2017-09-03 NOTE — Anesthesia Postprocedure Evaluation (Signed)
Anesthesia Post Note  Patient: Chase Frazier  Procedure(s) Performed: APPENDECTOMY LAPAROSCOPIC (N/A Abdomen)     Patient location during evaluation: PACU Anesthesia Type: General Level of consciousness: awake and alert Pain management: pain level controlled Vital Signs Assessment: post-procedure vital signs reviewed and stable Respiratory status: spontaneous breathing, nonlabored ventilation, respiratory function stable and patient connected to nasal cannula oxygen Cardiovascular status: blood pressure returned to baseline and stable Postop Assessment: no apparent nausea or vomiting Anesthetic complications: no    Last Vitals:  Vitals:   09/03/17 0930 09/03/17 0940  BP: 121/70 122/81  Pulse: 68 86  Resp: 16 16  Temp:  36.7 C  SpO2: 93% 93%    Last Pain:  Vitals:   09/03/17 0910  TempSrc:   PainSc: Asleep                 Beryle Lathe

## 2017-09-03 NOTE — Op Note (Signed)
09/03/2017  8:27 AM  PATIENT:  Chase Frazier  46 y.o. male  PRE-OPERATIVE DIAGNOSIS:  HISTORY OF PERFORATED APPENDICITIS  POST-OPERATIVE DIAGNOSIS:  HISTORY OF PERFORATED APPENDICITIS  PROCEDURE:  Procedure(s): APPENDECTOMY LAPAROSCOPIC (N/A)  SURGEON:  Surgeon(s) and Role:    * Axel Filler, MD - Primary  ANESTHESIA:   local and general  EBL:5cc   Total I/O In: 1000 [I.V.:1000] Out: -   BLOOD ADMINISTERED:none  DRAINS: none   LOCAL MEDICATIONS USED:  BUPIVICAINE   SPECIMEN:  Source of Specimen:  appendix  DISPOSITION OF SPECIMEN:  PATHOLOGY  COUNTS:  YES  TOURNIQUET:  * No tourniquets in log *  DICTATION: .Dragon Dictation  Complications: none  Counts: reported as correct x 2  Findings:  The patient had a chronic inflamed appendix  Specimen: Appendix  Indications for procedure:  The patient is a 46 year old male with a history of perforated appendicitis and phlegmon.  Pt was treated medically and was followed in clinic and decided to proceed to the OR for interval lap appy.  Details of the procedure:The patient was taken back to the operating room. The patient was placed in supine position with bilateral SCDs in place.  A foley catheter was place. The patient was prepped and draped in the usual sterile fashion.  After appropriate anitbiotics were confirmed, a time-out was confirmed and all facts were verified.    A pneumoperitoneum of 14 mmHg was obtained via a Veress needle technique in the left lower quadrant quadrant.  A 5 mm trocar and 5 mm camera then placed intra-abdominally there is no injury to any intra-abdominal organs a 10 mm infraumbilical port was placed and direct visualization as was a 5 mm port in the suprapubic area.   The right colon and cecum were identified. The white line of Toldt was incised to help reflect the cecum medially. Upon doing so the tinea was traced down to the appendiceal base.The tip of the appendix was seen to be  intimately adherent to the terminal ileum and the mesentery of the terminal ileum.  At this time the appendiceal base was transected. 0 PDS 2 Endoloops were then placed on the base of the appendix. The distal end of the appendix with then retracted anteriorly. I stayed close along the appendix and sharply dissect this away from the mesoappendix and mesentery of the terminal ileum.This was intimately attached to the terminal ileum. It was difficult to assess whether or not any serosa was injured. At this time a 3-0 silk was used in a Lembert fashion to suture the area of terminal ileum that the tip of the appendix was lying on. I did not see any serosal tear, there was no injury to the serosa that I could confirm was injured.  A retrieval bag was then placed into the abdomen and the specimen placed in the bag. The appendiceal stump was cauterized. We evacuate the fluid from the pelvis until the effluent was clear.  The appendix and retrieval  bag was then retrieved via the supraumbilical port. #1 Vicryl was used to reapproximate the fascia at the umbilical port site x1. The skin was reapproximated all port sites 3-0 Monocryl subcuticular fashion. The skin was dressed with steri-strips, guaze, and tape.  The patient had the foley removed. The patient was awakened from general anesthesia was taken to recovery room in stable condition.      PLAN OF CARE: Discharge to home after PACU  PATIENT DISPOSITION:  PACU - hemodynamically stable.   Delay  start of Pharmacological VTE agent (>24hrs) due to surgical blood loss or risk of bleeding: not applicable

## 2017-09-03 NOTE — H&P (View-Only) (Signed)
History of Present Illness (Maryori Weide MD; 07/31/2017 4:31 PM) The patient is a 45 year old male who presents with appendicitis. 45-year-old male with a ruptured appendicitis that is been treated nonoperatively. Patient has had some recurring right lower quadrant abdominal pain. He states better more times than not. He states his burning sensation. He states it is been better recently. He states he has not had a fever. He's had no signs or symptoms of sepsis, diarrhea, has some constipation but states this is due to his diet.    Review of Systems (Abner Ardis, MD; 07/31/2017 4:32 PM) General Present- Appetite Loss, Fatigue, Fever and Night Sweats. Not Present- Chills, Weight Gain and Weight Loss. Skin Not Present- Change in Wart/Mole, Dryness, Hives, Jaundice, New Lesions, Non-Healing Wounds, Rash and Ulcer. HEENT Present- Seasonal Allergies, Sinus Pain and Wears glasses/contact lenses. Not Present- Earache, Hearing Loss, Hoarseness, Nose Bleed, Oral Ulcers, Ringing in the Ears, Sore Throat, Visual Disturbances and Yellow Eyes. Breast Not Present- Breast Mass, Breast Pain, Nipple Discharge and Skin Changes. Cardiovascular Not Present- Chest Pain, Difficulty Breathing Lying Down, Leg Cramps, Palpitations, Rapid Heart Rate, Shortness of Breath and Swelling of Extremities. Gastrointestinal Present- Abdominal Pain, Constipation and Nausea. Not Present- Bloating, Bloody Stool, Change in Bowel Habits, Chronic diarrhea, Difficulty Swallowing, Excessive gas, Gets full quickly at meals, Hemorrhoids, Indigestion, Rectal Pain and Vomiting. Male Genitourinary Not Present- Blood in Urine, Change in Urinary Stream, Frequency, Impotence, Nocturia, Painful Urination, Urgency and Urine Leakage. Neurological Present- Weakness. Not Present- Decreased Memory, Fainting, Headaches, Numbness, Seizures, Tingling, Tremor and Trouble walking. Psychiatric Not Present- Anxiety, Bipolar, Change in Sleep Pattern,  Depression, Fearful and Frequent crying. Endocrine Present- Heat Intolerance. Not Present- Cold Intolerance, Excessive Hunger, Hair Changes and New Diabetes. Hematology Not Present- Blood Thinners, Easy Bruising, Excessive bleeding, Gland problems, HIV and Persistent Infections.   Physical Exam (Kwan Shellhammer, MD; 07/31/2017 4:32 PM) General Mental Status-Alert. General Appearance-Consistent with stated age. Hydration-Well hydrated. Voice-Normal.  Chest and Lung Exam Chest and lung exam reveals -quiet, even and easy respiratory effort with no use of accessory muscles and on auscultation, normal breath sounds, no adventitious sounds and normal vocal resonance. Inspection Chest Wall - Normal. Back - normal.  Cardiovascular Cardiovascular examination reveals -normal heart sounds, regular rate and rhythm with no murmurs and normal pedal pulses bilaterally.  Abdomen Inspection Normal Exam - No Hernias. Skin - Scar - no surgical scars. Palpation/Percussion Normal exam - Soft, Non Tender, No Rebound tenderness, No Rigidity (guarding) and No hepatosplenomegaly. Auscultation Normal exam - Bowel sounds normal.  Neurologic Neurologic evaluation reveals -alert and oriented x 3 with no impairment of recent or remote memory. Mental Status-Normal.  Musculoskeletal Normal Exam - Left-Upper Extremity Strength Normal and Lower Extremity Strength Normal. Normal Exam - Right-Upper Extremity Strength Normal and Lower Extremity Strength Normal.    Assessment & Plan (Micco Bourbeau MD; 07/31/2017 4:32 PM) RUPTURED APPENDICITIS (K35.2) Impression: 45-year-old male with a history of ruptured appendicitis treated nonoperatively in the hospital secondary to phlegmon formation. 1. The patient would like to proceed with surgery.  2  I Did discuss with him the risks and benefits of the procedure to include but not limited to: Infection, bleeding, damage to structures, possible need  for ostomy, possible colon and/or small bowel resection. The patient was understanding and wishes to proceed 

## 2017-09-03 NOTE — Interval H&P Note (Signed)
History and Physical Interval Note:  09/03/2017 7:09 AM  Chase Frazier  has presented today for surgery, with the diagnosis of history of perforated appendicitis   The various methods of treatment have been discussed with the patient and family. After consideration of risks, benefits and other options for treatment, the patient has consented to  Procedure(s): APPENDECTOMY LAPAROSCOPIC (N/A) EXPLORATORY LAPAROTOMY (N/A) SMALL BOWEL RESECTION (N/A) as a surgical intervention .  The patient's history has been reviewed, patient examined, no change in status, stable for surgery.  I have reviewed the patient's chart and labs.  Questions were answered to the patient's satisfaction.     Marigene Ehlers., Jed Limerick

## 2017-09-03 NOTE — Anesthesia Procedure Notes (Addendum)
Procedure Name: Intubation Date/Time: 09/03/2017 7:37 AM Performed by: Julian Reil Pre-anesthesia Checklist: Patient identified, Emergency Drugs available, Suction available, Patient being monitored and Timeout performed Patient Re-evaluated:Patient Re-evaluated prior to induction Oxygen Delivery Method: Circle system utilized Preoxygenation: Pre-oxygenation with 100% oxygen Induction Type: IV induction Ventilation: Mask ventilation without difficulty Laryngoscope Size: Miller and 3 Grade View: Grade I Tube type: Oral Tube size: 7.5 mm Number of attempts: 1 Airway Equipment and Method: Stylet Placement Confirmation: ETT inserted through vocal cords under direct vision,  positive ETCO2 and breath sounds checked- equal and bilateral Secured at: 23 cm Tube secured with: Tape Dental Injury: Teeth and Oropharynx as per pre-operative assessment  Comments: Noted very poor dentition of lower front teeth, blackened at gums, none loose per patient report.

## 2017-09-03 NOTE — Transfer of Care (Signed)
Immediate Anesthesia Transfer of Care Note  Patient: Chase Frazier  Procedure(s) Performed: APPENDECTOMY LAPAROSCOPIC (N/A Abdomen)  Patient Location: PACU  Anesthesia Type:General  Level of Consciousness: awake and patient cooperative  Airway & Oxygen Therapy: Patient Spontanous Breathing and Patient connected to nasal cannula oxygen  Post-op Assessment: Report given to RN and Post -op Vital signs reviewed and stable  Post vital signs: Reviewed and stable  Last Vitals:  Vitals:   09/03/17 0603  BP: (!) 146/70  Pulse: 84  Resp: 18  Temp: 36.7 C  SpO2: 100%    Last Pain:  Vitals:   09/03/17 0603  TempSrc: Oral  PainSc:          Complications: No apparent anesthesia complications

## 2017-09-03 NOTE — Discharge Instructions (Signed)
Laparoscopic Appendectomy, Adult, Care After °These instructions give you information about caring for yourself after your procedure. Your doctor may also give you more specific instructions. Call your doctor if you have any problems or questions after your procedure. °Follow these instructions at home: °Medicines °· Take over-the-counter and prescription medicines only as told by your doctor. °· Do not drive for 24 hours if you received a sedative. °· Do not drive or use heavy machinery while taking prescription pain medicine. °· If you were prescribed an antibiotic medicine, take it as told by your doctor. Do not stop taking it even if you start to feel better. °Activity °· Do not lift anything that is heavier than 10 pounds (4.5 kg) for 3 weeks or as told by your doctor. °· Do not play contact sports for 3 weeks or as told by your doctor. °· Slowly return to your normal activities. °Bathing °· Keep your cuts from surgery (incisions) clean and dry. °? Gently wash the cuts with soap and water. °? Rinse the cuts with water until the soap is gone. °? Pat the cuts dry with a clean towel. Do not rub the cuts. °· You may take showers after 48 hours. °· Do not take baths, swim, or use a hot tub for 2 weeks or as told by your doctor. °Cut Care °· Follow instructions from your doctor about how to take care of your cuts. Make sure you: °? Wash your hands with soap and water before you change your bandage (dressing). If you do not have soap and water, use hand sanitizer. °? Change your bandage as told by your doctor. °? Leave stitches (sutures), skin glue, or skin tape (adhesive) strips in place. They may need to stay in place for 2 weeks or longer. If tape strips get loose and curl up, you may trim the loose edges. Do not remove tape strips completely unless your doctor says it is okay. °· Check your cuts every day for signs of infection. Check for: °? More redness, swelling, or pain. °? More fluid or  blood. °? Warmth. °? Pus or a bad smell. °Other Instructions °· If you were sent home with a drain, follow instructions from your doctor about how to use it and care for it. °· Take deep breaths. This helps to keep your lungs from getting swollen (inflamed). °· To help with constipation: °? Drink plenty of fluids. °? Eat plenty of fruits and vegetables. °· Keep all follow-up visits as told by your doctor. This is important. °Contact a doctor if: °· You have more redness, swelling, or pain around a cut from surgery. °· You have more fluid or blood coming from a cut. °· Your cut feels warm to the touch. °· You have pus or a bad smell coming from a cut or a bandage. °· The edges of a cut break open after the stitches have been taken out. °· You have pain in your shoulders that gets worse. °· You feel dizzy or you pass out (faint). °· You have shortness of breath. °· You keep feeling sick to your stomach (nauseous). °· You keep throwing up (vomiting). °· You get diarrhea or you cannot control your poop. °· You lose your appetite. °· You have swelling or pain in your legs. °Get help right away if: °· You have a fever. °· You get a rash. °· You have trouble breathing. °· You have sharp pains in your chest. °This information is not intended to replace advice given   to you by your health care provider. Make sure you discuss any questions you have with your health care provider. °Document Released: 09/06/2009 Document Revised: 04/17/2016 Document Reviewed: 04/30/2015 °[employer]vier Interactive Patient Education © 2018 [employer] ° °

## 2017-09-04 ENCOUNTER — Encounter (HOSPITAL_COMMUNITY): Payer: Self-pay | Admitting: General Surgery

## 2017-09-23 DIAGNOSIS — Z23 Encounter for immunization: Secondary | ICD-10-CM | POA: Diagnosis not present

## 2017-09-23 DIAGNOSIS — K219 Gastro-esophageal reflux disease without esophagitis: Secondary | ICD-10-CM | POA: Diagnosis not present

## 2017-09-23 DIAGNOSIS — F988 Other specified behavioral and emotional disorders with onset usually occurring in childhood and adolescence: Secondary | ICD-10-CM | POA: Diagnosis not present

## 2017-09-23 DIAGNOSIS — F419 Anxiety disorder, unspecified: Secondary | ICD-10-CM | POA: Diagnosis not present

## 2017-09-23 DIAGNOSIS — Z1331 Encounter for screening for depression: Secondary | ICD-10-CM | POA: Diagnosis not present

## 2017-09-23 DIAGNOSIS — E291 Testicular hypofunction: Secondary | ICD-10-CM | POA: Diagnosis not present

## 2017-09-23 DIAGNOSIS — Z79899 Other long term (current) drug therapy: Secondary | ICD-10-CM | POA: Diagnosis not present

## 2017-10-08 DIAGNOSIS — E291 Testicular hypofunction: Secondary | ICD-10-CM | POA: Diagnosis not present

## 2017-10-19 DIAGNOSIS — J209 Acute bronchitis, unspecified: Secondary | ICD-10-CM | POA: Diagnosis not present

## 2017-10-19 DIAGNOSIS — Z6836 Body mass index (BMI) 36.0-36.9, adult: Secondary | ICD-10-CM | POA: Diagnosis not present

## 2017-10-19 DIAGNOSIS — Z09 Encounter for follow-up examination after completed treatment for conditions other than malignant neoplasm: Secondary | ICD-10-CM | POA: Diagnosis not present

## 2018-03-25 DIAGNOSIS — Z79899 Other long term (current) drug therapy: Secondary | ICD-10-CM | POA: Diagnosis not present

## 2018-03-25 DIAGNOSIS — G4700 Insomnia, unspecified: Secondary | ICD-10-CM | POA: Diagnosis not present

## 2018-03-25 DIAGNOSIS — F419 Anxiety disorder, unspecified: Secondary | ICD-10-CM | POA: Diagnosis not present

## 2018-03-25 DIAGNOSIS — F988 Other specified behavioral and emotional disorders with onset usually occurring in childhood and adolescence: Secondary | ICD-10-CM | POA: Diagnosis not present

## 2018-03-25 DIAGNOSIS — R291 Meningismus: Secondary | ICD-10-CM | POA: Diagnosis not present

## 2018-03-25 DIAGNOSIS — E291 Testicular hypofunction: Secondary | ICD-10-CM | POA: Diagnosis not present

## 2018-09-30 DIAGNOSIS — Z79899 Other long term (current) drug therapy: Secondary | ICD-10-CM | POA: Diagnosis not present

## 2018-09-30 DIAGNOSIS — F419 Anxiety disorder, unspecified: Secondary | ICD-10-CM | POA: Diagnosis not present

## 2018-09-30 DIAGNOSIS — E291 Testicular hypofunction: Secondary | ICD-10-CM | POA: Diagnosis not present

## 2018-09-30 DIAGNOSIS — F988 Other specified behavioral and emotional disorders with onset usually occurring in childhood and adolescence: Secondary | ICD-10-CM | POA: Diagnosis not present

## 2018-09-30 DIAGNOSIS — Z1331 Encounter for screening for depression: Secondary | ICD-10-CM | POA: Diagnosis not present

## 2018-09-30 DIAGNOSIS — G4700 Insomnia, unspecified: Secondary | ICD-10-CM | POA: Diagnosis not present

## 2018-12-20 DIAGNOSIS — K5900 Constipation, unspecified: Secondary | ICD-10-CM | POA: Diagnosis not present

## 2018-12-20 DIAGNOSIS — R109 Unspecified abdominal pain: Secondary | ICD-10-CM | POA: Diagnosis not present

## 2018-12-20 DIAGNOSIS — Z6836 Body mass index (BMI) 36.0-36.9, adult: Secondary | ICD-10-CM | POA: Diagnosis not present

## 2019-03-31 DIAGNOSIS — E291 Testicular hypofunction: Secondary | ICD-10-CM | POA: Diagnosis not present

## 2019-03-31 DIAGNOSIS — G4700 Insomnia, unspecified: Secondary | ICD-10-CM | POA: Diagnosis not present

## 2019-03-31 DIAGNOSIS — F988 Other specified behavioral and emotional disorders with onset usually occurring in childhood and adolescence: Secondary | ICD-10-CM | POA: Diagnosis not present

## 2019-03-31 DIAGNOSIS — Z79899 Other long term (current) drug therapy: Secondary | ICD-10-CM | POA: Diagnosis not present

## 2019-03-31 DIAGNOSIS — F419 Anxiety disorder, unspecified: Secondary | ICD-10-CM | POA: Diagnosis not present

## 2019-05-05 DIAGNOSIS — M7711 Lateral epicondylitis, right elbow: Secondary | ICD-10-CM | POA: Diagnosis not present

## 2019-05-05 DIAGNOSIS — Z6835 Body mass index (BMI) 35.0-35.9, adult: Secondary | ICD-10-CM | POA: Diagnosis not present

## 2019-06-03 DIAGNOSIS — M7711 Lateral epicondylitis, right elbow: Secondary | ICD-10-CM | POA: Diagnosis not present

## 2019-06-03 DIAGNOSIS — Z6834 Body mass index (BMI) 34.0-34.9, adult: Secondary | ICD-10-CM | POA: Diagnosis not present

## 2019-06-03 DIAGNOSIS — M7712 Lateral epicondylitis, left elbow: Secondary | ICD-10-CM | POA: Diagnosis not present

## 2019-07-01 DIAGNOSIS — M7711 Lateral epicondylitis, right elbow: Secondary | ICD-10-CM | POA: Diagnosis not present

## 2019-07-01 DIAGNOSIS — M7712 Lateral epicondylitis, left elbow: Secondary | ICD-10-CM | POA: Diagnosis not present

## 2019-10-03 DIAGNOSIS — E291 Testicular hypofunction: Secondary | ICD-10-CM | POA: Diagnosis not present

## 2019-10-03 DIAGNOSIS — Z79899 Other long term (current) drug therapy: Secondary | ICD-10-CM | POA: Diagnosis not present

## 2019-10-03 DIAGNOSIS — F419 Anxiety disorder, unspecified: Secondary | ICD-10-CM | POA: Diagnosis not present

## 2019-10-03 DIAGNOSIS — F988 Other specified behavioral and emotional disorders with onset usually occurring in childhood and adolescence: Secondary | ICD-10-CM | POA: Diagnosis not present

## 2019-10-03 DIAGNOSIS — G4700 Insomnia, unspecified: Secondary | ICD-10-CM | POA: Diagnosis not present

## 2019-11-07 DIAGNOSIS — Z6829 Body mass index (BMI) 29.0-29.9, adult: Secondary | ICD-10-CM | POA: Diagnosis not present

## 2019-11-07 DIAGNOSIS — F329 Major depressive disorder, single episode, unspecified: Secondary | ICD-10-CM | POA: Diagnosis not present

## 2019-11-07 DIAGNOSIS — F419 Anxiety disorder, unspecified: Secondary | ICD-10-CM | POA: Diagnosis not present

## 2019-12-08 DIAGNOSIS — F329 Major depressive disorder, single episode, unspecified: Secondary | ICD-10-CM | POA: Diagnosis not present

## 2019-12-08 DIAGNOSIS — Z6829 Body mass index (BMI) 29.0-29.9, adult: Secondary | ICD-10-CM | POA: Diagnosis not present

## 2020-01-09 DIAGNOSIS — E291 Testicular hypofunction: Secondary | ICD-10-CM | POA: Diagnosis not present

## 2020-01-09 DIAGNOSIS — F329 Major depressive disorder, single episode, unspecified: Secondary | ICD-10-CM | POA: Diagnosis not present

## 2020-01-09 DIAGNOSIS — F419 Anxiety disorder, unspecified: Secondary | ICD-10-CM | POA: Diagnosis not present

## 2020-01-09 DIAGNOSIS — F988 Other specified behavioral and emotional disorders with onset usually occurring in childhood and adolescence: Secondary | ICD-10-CM | POA: Diagnosis not present

## 2020-01-27 DIAGNOSIS — M1711 Unilateral primary osteoarthritis, right knee: Secondary | ICD-10-CM | POA: Diagnosis not present

## 2020-01-27 DIAGNOSIS — M7711 Lateral epicondylitis, right elbow: Secondary | ICD-10-CM | POA: Diagnosis not present

## 2020-01-27 DIAGNOSIS — M25512 Pain in left shoulder: Secondary | ICD-10-CM | POA: Diagnosis not present

## 2020-01-27 DIAGNOSIS — M1712 Unilateral primary osteoarthritis, left knee: Secondary | ICD-10-CM | POA: Diagnosis not present

## 2020-01-27 DIAGNOSIS — M7712 Lateral epicondylitis, left elbow: Secondary | ICD-10-CM | POA: Diagnosis not present

## 2020-04-09 DIAGNOSIS — F419 Anxiety disorder, unspecified: Secondary | ICD-10-CM | POA: Diagnosis not present

## 2020-04-09 DIAGNOSIS — E291 Testicular hypofunction: Secondary | ICD-10-CM | POA: Diagnosis not present

## 2020-04-09 DIAGNOSIS — G4700 Insomnia, unspecified: Secondary | ICD-10-CM | POA: Diagnosis not present

## 2020-04-09 DIAGNOSIS — F988 Other specified behavioral and emotional disorders with onset usually occurring in childhood and adolescence: Secondary | ICD-10-CM | POA: Diagnosis not present

## 2020-04-09 DIAGNOSIS — Z79899 Other long term (current) drug therapy: Secondary | ICD-10-CM | POA: Diagnosis not present

## 2020-04-13 DIAGNOSIS — E291 Testicular hypofunction: Secondary | ICD-10-CM | POA: Diagnosis not present

## 2020-04-13 DIAGNOSIS — Z79899 Other long term (current) drug therapy: Secondary | ICD-10-CM | POA: Diagnosis not present

## 2022-09-02 ENCOUNTER — Other Ambulatory Visit: Payer: Self-pay | Admitting: Physician Assistant

## 2022-09-02 DIAGNOSIS — R748 Abnormal levels of other serum enzymes: Secondary | ICD-10-CM

## 2022-09-09 ENCOUNTER — Ambulatory Visit
Admission: RE | Admit: 2022-09-09 | Discharge: 2022-09-09 | Disposition: A | Discharge status: Home / Self Care | Payer: BC Managed Care – PPO | Source: Ambulatory Visit | Attending: Physician Assistant | Admitting: Physician Assistant

## 2022-09-09 DIAGNOSIS — R748 Abnormal levels of other serum enzymes: Secondary | ICD-10-CM
